# Patient Record
Sex: Male | Born: 1990 | Race: White | Hispanic: No | Marital: Single | Smoking: Never smoker
Health system: Southern US, Community
[De-identification: ages and names within clinical notes are randomized; demographics above are authoritative.]

## PROBLEM LIST (undated history)

## (undated) DIAGNOSIS — F1410 Cocaine abuse, uncomplicated: Secondary | ICD-10-CM

## (undated) DIAGNOSIS — G47419 Narcolepsy without cataplexy: Secondary | ICD-10-CM

## (undated) DIAGNOSIS — R569 Unspecified convulsions: Secondary | ICD-10-CM

## (undated) HISTORY — DX: Cocaine abuse, uncomplicated: F14.10

## (undated) HISTORY — DX: Unspecified convulsions: R56.9

## (undated) HISTORY — PX: SHOULDER SURGERY: SHX246

---

## 2011-05-14 ENCOUNTER — Emergency Department (HOSPITAL_COMMUNITY)
Admission: EM | Admit: 2011-05-14 | Discharge: 2011-05-14 | Disposition: A | Discharge status: Home / Self Care | Payer: BC Managed Care – HMO | Attending: Emergency Medicine | Admitting: Emergency Medicine

## 2011-05-14 ENCOUNTER — Emergency Department (HOSPITAL_COMMUNITY): Payer: BC Managed Care – HMO

## 2011-05-14 DIAGNOSIS — W108XXA Fall (on) (from) other stairs and steps, initial encounter: Secondary | ICD-10-CM | POA: Insufficient documentation

## 2011-05-14 DIAGNOSIS — R220 Localized swelling, mass and lump, head: Secondary | ICD-10-CM | POA: Insufficient documentation

## 2011-05-14 DIAGNOSIS — R51 Headache: Secondary | ICD-10-CM | POA: Insufficient documentation

## 2011-05-14 DIAGNOSIS — R110 Nausea: Secondary | ICD-10-CM | POA: Insufficient documentation

## 2011-05-14 DIAGNOSIS — R42 Dizziness and giddiness: Secondary | ICD-10-CM | POA: Insufficient documentation

## 2011-05-14 DIAGNOSIS — S0180XA Unspecified open wound of other part of head, initial encounter: Secondary | ICD-10-CM | POA: Insufficient documentation

## 2011-08-12 ENCOUNTER — Ambulatory Visit (INDEPENDENT_AMBULATORY_CARE_PROVIDER_SITE_OTHER): Payer: PRIVATE HEALTH INSURANCE

## 2011-08-12 DIAGNOSIS — B308 Other viral conjunctivitis: Secondary | ICD-10-CM

## 2014-02-04 DIAGNOSIS — M25519 Pain in unspecified shoulder: Secondary | ICD-10-CM | POA: Insufficient documentation

## 2015-05-15 ENCOUNTER — Encounter (HOSPITAL_COMMUNITY): Payer: Self-pay | Admitting: Emergency Medicine

## 2015-05-15 ENCOUNTER — Emergency Department (HOSPITAL_COMMUNITY)
Admission: EM | Admit: 2015-05-15 | Discharge: 2015-05-15 | Disposition: A | Discharge status: Home / Self Care | Payer: BC Managed Care – PPO | Attending: Emergency Medicine | Admitting: Emergency Medicine

## 2015-05-15 DIAGNOSIS — Y9363 Activity, rugby: Secondary | ICD-10-CM | POA: Diagnosis not present

## 2015-05-15 DIAGNOSIS — S0990XA Unspecified injury of head, initial encounter: Secondary | ICD-10-CM | POA: Diagnosis present

## 2015-05-15 DIAGNOSIS — Y998 Other external cause status: Secondary | ICD-10-CM | POA: Diagnosis not present

## 2015-05-15 DIAGNOSIS — Z8669 Personal history of other diseases of the nervous system and sense organs: Secondary | ICD-10-CM | POA: Diagnosis not present

## 2015-05-15 DIAGNOSIS — W500XXA Accidental hit or strike by another person, initial encounter: Secondary | ICD-10-CM | POA: Diagnosis not present

## 2015-05-15 DIAGNOSIS — S060X0A Concussion without loss of consciousness, initial encounter: Secondary | ICD-10-CM | POA: Insufficient documentation

## 2015-05-15 DIAGNOSIS — Y9289 Other specified places as the place of occurrence of the external cause: Secondary | ICD-10-CM | POA: Diagnosis not present

## 2015-05-15 DIAGNOSIS — S80212A Abrasion, left knee, initial encounter: Secondary | ICD-10-CM | POA: Diagnosis not present

## 2015-05-15 HISTORY — DX: Narcolepsy without cataplexy: G47.419

## 2015-05-15 MED ORDER — BACITRACIN ZINC 500 UNIT/GM EX OINT
TOPICAL_OINTMENT | CUTANEOUS | Status: AC
  Administered 2015-05-15: 16:00:00
  Filled 2015-05-15: qty 0.9

## 2015-05-15 NOTE — Discharge Instructions (Signed)
Recheck with physician prior to resuming any sports activities.   Concussion A concussion, or closed-head injury, is a brain injury caused by a direct blow to the head or by a quick and sudden movement (jolt) of the head or neck. Concussions are usually not life-threatening. Even so, the effects of a concussion can be serious. If you have had a concussion before, you are more likely to experience concussion-like symptoms after a direct blow to the head.  CAUSES  Direct blow to the head, such as from running into another player during a soccer game, being hit in a fight, or hitting your head on a hard surface.  A jolt of the head or neck that causes the brain to move back and forth inside the skull, such as in a car crash. SIGNS AND SYMPTOMS The signs of a concussion can be hard to notice. Early on, they may be missed by you, family members, and health care providers. You may look fine but act or feel differently. Symptoms are usually temporary, but they may last for days, weeks, or even longer. Some symptoms may appear right away while others may not show up for hours or days. Every head injury is different. Symptoms include:  Mild to moderate headaches that will not go away.  A feeling of pressure inside your head.  Having more trouble than usual:  Learning or remembering things you have heard.  Answering questions.  Paying attention or concentrating.  Organizing daily tasks.  Making decisions and solving problems.  Slowness in thinking, acting or reacting, speaking, or reading.  Getting lost or being easily confused.  Feeling tired all the time or lacking energy (fatigued).  Feeling drowsy.  Sleep disturbances.  Sleeping more than usual.  Sleeping less than usual.  Trouble falling asleep.  Trouble sleeping (insomnia).  Loss of balance or feeling lightheaded or dizzy.  Nausea or vomiting.  Numbness or tingling.  Increased sensitivity  to:  Sounds.  Lights.  Distractions.  Vision problems or eyes that tire easily.  Diminished sense of taste or smell.  Ringing in the ears.  Mood changes such as feeling sad or anxious.  Becoming easily irritated or angry for little or no reason.  Lack of motivation.  Seeing or hearing things other people do not see or hear (hallucinations). DIAGNOSIS Your health care provider can usually diagnose a concussion based on a description of your injury and symptoms. He or she will ask whether you passed out (lost consciousness) and whether you are having trouble remembering events that happened right before and during your injury. Your evaluation might include:  A brain scan to look for signs of injury to the brain. Even if the test shows no injury, you may still have a concussion.  Blood tests to be sure other problems are not present. TREATMENT  Concussions are usually treated in an emergency department, in urgent care, or at a clinic. You may need to stay in the hospital overnight for further treatment.  Tell your health care provider if you are taking any medicines, including prescription medicines, over-the-counter medicines, and natural remedies. Some medicines, such as blood thinners (anticoagulants) and aspirin, may increase the chance of complications. Also tell your health care provider whether you have had alcohol or are taking illegal drugs. This information may affect treatment.  Your health care provider will send you home with important instructions to follow.  How fast you will recover from a concussion depends on many factors. These factors include how severe  your concussion is, what part of your brain was injured, your age, and how healthy you were before the concussion.  Most people with mild injuries recover fully. Recovery can take time. In general, recovery is slower in older persons. Also, persons who have had a concussion in the past or have other medical  problems may find that it takes longer to recover from their current injury. HOME CARE INSTRUCTIONS General Instructions  Carefully follow the directions your health care provider gave you.  Only take over-the-counter or prescription medicines for pain, discomfort, or fever as directed by your health care provider.  Take only those medicines that your health care provider has approved.  Do not drink alcohol until your health care provider says you are well enough to do so. Alcohol and certain other drugs may slow your recovery and can put you at risk of further injury.  If it is harder than usual to remember things, write them down.  If you are easily distracted, try to do one thing at a time. For example, do not try to watch TV while fixing dinner.  Talk with family members or close friends when making important decisions.  Keep all follow-up appointments. Repeated evaluation of your symptoms is recommended for your recovery.  Watch your symptoms and tell others to do the same. Complications sometimes occur after a concussion. Older adults with a brain injury may have a higher risk of serious complications, such as a blood clot on the brain.  Tell your teachers, school nurse, school counselor, coach, athletic trainer, or work Production designer, theatre/television/film about your injury, symptoms, and restrictions. Tell them about what you can or cannot do. They should watch for:  Increased problems with attention or concentration.  Increased difficulty remembering or learning new information.  Increased time needed to complete tasks or assignments.  Increased irritability or decreased ability to cope with stress.  Increased symptoms.  Rest. Rest helps the brain to heal. Make sure you:  Get plenty of sleep at night. Avoid staying up late at night.  Keep the same bedtime hours on weekends and weekdays.  Rest during the day. Take daytime naps or rest breaks when you feel tired.  Limit activities that require a  lot of thought or concentration. These include:  Doing homework or job-related work.  Watching TV.  Working on the computer.  Avoid any situation where there is potential for another head injury (football, hockey, soccer, basketball, martial arts, downhill snow sports and horseback riding). Your condition will get worse every time you experience a concussion. You should avoid these activities until you are evaluated by the appropriate follow-up health care providers. Returning To Your Regular Activities You will need to return to your normal activities slowly, not all at once. You must give your body and brain enough time for recovery.  Do not return to sports or other athletic activities until your health care provider tells you it is safe to do so.  Ask your health care provider when you can drive, ride a bicycle, or operate heavy machinery. Your ability to react may be slower after a brain injury. Never do these activities if you are dizzy.  Ask your health care provider about when you can return to work or school. Preventing Another Concussion It is very important to avoid another brain injury, especially before you have recovered. In rare cases, another injury can lead to permanent brain damage, brain swelling, or death. The risk of this is greatest during the first 7-10 days  after a head injury. Avoid injuries by:  Wearing a seat belt when riding in a car.  Drinking alcohol only in moderation.  Wearing a helmet when biking, skiing, skateboarding, skating, or doing similar activities.  Avoiding activities that could lead to a second concussion, such as contact or recreational sports, until your health care provider says it is okay.  Taking safety measures in your home.  Remove clutter and tripping hazards from floors and stairways.  Use grab bars in bathrooms and handrails by stairs.  Place non-slip mats on floors and in bathtubs.  Improve lighting in dim areas. SEEK MEDICAL  CARE IF:  You have increased problems paying attention or concentrating.  You have increased difficulty remembering or learning new information.  You need more time to complete tasks or assignments than before.  You have increased irritability or decreased ability to cope with stress.  You have more symptoms than before. Seek medical care if you have any of the following symptoms for more than 2 weeks after your injury:  Lasting (chronic) headaches.  Dizziness or balance problems.  Nausea.  Vision problems.  Increased sensitivity to noise or light.  Depression or mood swings.  Anxiety or irritability.  Memory problems.  Difficulty concentrating or paying attention.  Sleep problems.  Feeling tired all the time. SEEK IMMEDIATE MEDICAL CARE IF:  You have severe or worsening headaches. These may be a sign of a blood clot in the brain.  You have weakness (even if only in one hand, leg, or part of the face).  You have numbness.  You have decreased coordination.  You vomit repeatedly.  You have increased sleepiness.  One pupil is larger than the other.  You have convulsions.  You have slurred speech.  You have increased confusion. This may be a sign of a blood clot in the brain.  You have increased restlessness, agitation, or irritability.  You are unable to recognize people or places.  You have neck pain.  It is difficult to wake you up.  You have unusual behavior changes.  You lose consciousness. MAKE SURE YOU:  Understand these instructions.  Will watch your condition.  Will get help right away if you are not doing well or get worse. Document Released: 11/11/2003 Document Revised: 08/26/2013 Document Reviewed: 03/13/2013 [facility] Patient Information 2015 [place], [place]. This information is not intended to replace advice given to you by your health care provider. Make sure you discuss any questions you have with your health care  provider.  Concussion Direct trauma to the head often causes a condition known as a concussion. This injury can temporarily interfere with brain function and may cause you to pass out (lose consciousness). The consequences of a concussion are usually short-term, but repetitive concussions can be very dangerous. If you have multiple concussions, you will have a greater risk of long-term effects, such as slurred speech, slow movements, impaired thinking, or tremors. The severity of a concussion is based on the length and severity of the interference with brain activity. SYMPTOMS  Symptoms of a concussion vary depending on the severity of the injury. Very mild concussions may even occur without any noticeable symptoms. Swelling in the area of the injury is not related to the seriousness of the injury.   Mild concussion:  Temporary loss of consciousness may or may not occur.  Memory loss (amnesia) for a short time.  Emotional instability.  Confusion.  Severe concussion:  Usually prolonged loss of consciousness.  Confusion  One pupil (the  black part in the middle of the eye) is larger than the other.  Changes in vision (including blurring).  Changes in breathing.  Disturbed balance (equilibrium).  Headaches.  Confusion.  Nausea or vomiting.  Slower reaction time than normal.  Difficulty learning and remembering things you have heard. CAUSES  A concussion is the result of trauma to the head. When the head is subjected to such an injury, the brain strikes against the inner wall of the skull. This impact is what causes the damage to the brain. The force of injury is related to severity of injury. The most severe concussions are associated with incidents that involve large impact forces such as motor vehicle accidents. Wearing a helmet will reduce the severity of trauma to the head, but concussions may still occur if you are wearing a helmet. RISK INCREASES WITH:  Contact sports  (football, hockey, soccer, rugby, basketball or lacrosse).  Fighting sports (martial arts or boxing).  Riding bicycles, motorcycles, or horses (when you ride without a helmet). PREVENTION  Wear proper protective headgear and ensure correct fit.  Wear seat belts when driving and riding in a car.  Do not drink or use mind-altering drugs and drive. PROGNOSIS  Concussions are typically curable if they are recognized and treated early. If a severe concussion or multiple concussions go untreated, then the complications may be life-threatening or cause permanent disability and brain damage. RELATED COMPLICATIONS   Permanent brain damage (slurred speech, slow movement, impaired thinking, or tremors).  Bleeding under the skull (subdural hemorrhage or hematoma, epidural hematoma).  Bleeding into the brain.  Prolonged healing time if usual activities are resumed too soon.  Infection if skin over the concussion site is broken.  Increased risk of future concussions (less trauma is required for a second concussion than the first). TREATMENT  Treatment initially requires immediate evaluation to determine the severity of the concussion. Occasionally, a hospital stay may be required for observation and treatment.  Avoid exertion. Bed rest for the first 24-48 hours is recommended.  Return to play is a controversial subject due to the increased risk for future injury as well as permanent disability and should be discussed at length with your treating caregiver. Many factors such as the severity of the concussion and whether this is the first, second, or third concussion play a role in timing a patient's return to sports.  MEDICATION  Do not give any medicine, including non-prescription acetaminophen or aspirin, until the diagnosis is certain. These medicines may mask developing symptoms.  SEEK IMMEDIATE MEDICAL CARE IF:   Symptoms get worse or do not improve in 24 hours.  Any of the following  symptoms occur:  Vomiting.  The inability to move arms and legs equally well on both sides.  Fever.  Neck stiffness.  Pupils of unequal size, shape, or reactivity.  Convulsions.  Noticeable restlessness.  Severe headache that persists for longer than 4 hours after injury.  Confusion, disorientation, or mental status changes. Document Released: 08/21/2005 Document Revised: 06/11/2013 Document Reviewed: 12/03/2008 [facility] Patient Information 2015 [place], [place]. This information is not intended to replace advice given to you by your health care provider. Make sure you discuss any questions you have with your health care provider.

## 2015-05-15 NOTE — Progress Notes (Addendum)
MD in with pt. Pt has clear speech . Pt does not have a HA - left knee cleansed with sterile water and bactriacn. Pt refused a bandaide-. Discussed pt concerns about narcolepsy with+he PA

## 2015-05-15 NOTE — ED Provider Notes (Signed)
CSN: [id number]     Arrival date & time 05/15/15  1440 History   First MD Initiated Contact with Patient 05/15/15 1504     Chief Complaint  Patient presents with  . Head Injury     (Consider location/radiation/quality/duration/timing/severity/associated sxs/prior Treatment) HPI 24 y.o. Male playing rugby just prior to admission reports blow to head by leg/knee of opponent.  Denies loc , states "bell rung".  He reports that he had difficulty remembering things told to him, some headache, and nausea.  He tried to play again but felt he couldn't.  Patient seen and reports screened by medic and brought here.  He reports one prior significant concussion in high school that was "much worse."  No significant headache now, no loc, no confusion now, remembers event.  Past Medical History  Diagnosis Date  . Narcolepsy    Past Surgical History  Procedure Laterality Date  . Shoulder surgery Right    No family history on file. Social History  Substance Use Topics  . Smoking status: Never Smoker   . Smokeless tobacco: None  . Alcohol Use: Yes    Review of Systems  Musculoskeletal:       Abrasion knee  All other systems reviewed and are negative.     Allergies  Review of patient's allergies indicates no known allergies.  Home Medications   Prior to Admission medications   Not on File   BP 129/67 mmHg  Pulse 93  Temp(Src) 97.9 F (36.6 C) (Oral)  Resp 20  SpO2 97% Physical Exam  Constitutional: He is oriented to person, place, and time. He appears well-developed and well-nourished.  HENT:  Head: Normocephalic and atraumatic.  Right Ear: Tympanic membrane and external ear normal.  Left Ear: Tympanic membrane and external ear normal.  Nose: Nose normal. Right sinus exhibits no maxillary sinus tenderness and no frontal sinus tenderness. Left sinus exhibits no maxillary sinus tenderness and no frontal sinus tenderness.  Eyes: Conjunctivae and EOM are normal. Pupils are equal,  round, and reactive to light. Right eye exhibits no nystagmus. Left eye exhibits no nystagmus.  Neck: Normal range of motion. Neck supple.  Cardiovascular: Normal rate, regular rhythm, normal heart sounds and intact distal pulses.   Pulmonary/Chest: Effort normal and breath sounds normal. No respiratory distress. He exhibits no tenderness.  Abdominal: Soft. Bowel sounds are normal. He exhibits no distension and no mass. There is no tenderness.  Musculoskeletal: Normal range of motion. He exhibits no edema or tenderness.  Abrasion left knee  Neurological: He is alert and oriented to person, place, and time. He has normal strength and normal reflexes. No sensory deficit. He displays a negative Romberg sign. GCS eye subscore is 4. GCS verbal subscore is 5. GCS motor subscore is 6.  Reflex Scores:      Tricep reflexes are 2+ on the right side and 2+ on the left side.      Bicep reflexes are 2+ on the right side and 2+ on the left side.      Brachioradialis reflexes are 2+ on the right side and 2+ on the left side.      Patellar reflexes are 2+ on the right side and 2+ on the left side.      Achilles reflexes are 2+ on the right side and 2+ on the left side. Patient with normal gait without ataxia, shuffling, spasm, or antalgia. Speech is normal without dysarthria, dysphasia, or aphasia. Muscle strength is 5/5 in bilateral shoulders, elbow flexor and extensors,  wrist flexor and extensors, and intrinsic hand muscles. 5/5 bilateral lower extremity hip flexors, extensors, knee flexors and extensors, and ankle dorsi and plantar flexors.    Skin: Skin is warm and dry. No rash noted.  Psychiatric: He has a normal mood and affect. His behavior is normal. Judgment and thought content normal.  Nursing note and vitals reviewed.   ED Course  Procedures (including critical care time) Patient to have wounds cleaned.  Friend to be advised of return precautions.   MDM   Final diagnoses:  Concussion,  without loss of consciousness, initial encounter    Discussed with patient indications for head ct including risk of significant ic injury which could benefit from surgery vs risk of radiation.  Patient states would rather not have head ct- I think this is reasonable as patient without significant risk factors - no anticoagulants, no intoxication, no loc, normal neuro exam and has someone to stay with him. Discussed need for follow up and neuro referral given if patient is having problems at first check.  Advised of return precautions and voices understanding.    Margarita Grizzle, MD 05/15/15 [phone]

## 2015-05-15 NOTE — ED Notes (Addendum)
Per EMS: Pt had knee to head during rugby. No LOC but c/o nausea, confusion and competitive questioning. Pt answering questions appropriately in triage.

## 2016-07-30 ENCOUNTER — Emergency Department (HOSPITAL_COMMUNITY): Payer: BC Managed Care – PPO

## 2016-07-30 ENCOUNTER — Emergency Department (HOSPITAL_COMMUNITY)
Admission: EM | Admit: 2016-07-30 | Discharge: 2016-07-30 | Disposition: A | Discharge status: Home / Self Care | Payer: BC Managed Care – PPO | Attending: Emergency Medicine | Admitting: Emergency Medicine

## 2016-07-30 ENCOUNTER — Encounter (HOSPITAL_COMMUNITY): Payer: Self-pay | Admitting: Emergency Medicine

## 2016-07-30 DIAGNOSIS — W109XXA Fall (on) (from) unspecified stairs and steps, initial encounter: Secondary | ICD-10-CM | POA: Insufficient documentation

## 2016-07-30 DIAGNOSIS — Y999 Unspecified external cause status: Secondary | ICD-10-CM | POA: Diagnosis not present

## 2016-07-30 DIAGNOSIS — S0990XA Unspecified injury of head, initial encounter: Secondary | ICD-10-CM | POA: Diagnosis present

## 2016-07-30 DIAGNOSIS — S0181XA Laceration without foreign body of other part of head, initial encounter: Secondary | ICD-10-CM

## 2016-07-30 DIAGNOSIS — Z23 Encounter for immunization: Secondary | ICD-10-CM | POA: Insufficient documentation

## 2016-07-30 DIAGNOSIS — Z79899 Other long term (current) drug therapy: Secondary | ICD-10-CM | POA: Insufficient documentation

## 2016-07-30 DIAGNOSIS — Y929 Unspecified place or not applicable: Secondary | ICD-10-CM | POA: Diagnosis not present

## 2016-07-30 DIAGNOSIS — Y939 Activity, unspecified: Secondary | ICD-10-CM | POA: Insufficient documentation

## 2016-07-30 LAB — CBG MONITORING, ED: Glucose-Capillary: 132 mg/dL — ABNORMAL HIGH (ref 65–99)

## 2016-07-30 MED ORDER — LIDOCAINE-EPINEPHRINE (PF) 2 %-1:200000 IJ SOLN
10.0000 mL | Freq: Once | INTRAMUSCULAR | Status: AC
  Administered 2016-07-30: 10 mL via INTRADERMAL
  Filled 2016-07-30: qty 20

## 2016-07-30 MED ORDER — TETANUS-DIPHTH-ACELL PERTUSSIS 5-2.5-18.5 LF-MCG/0.5 IM SUSP
0.5000 mL | Freq: Once | INTRAMUSCULAR | Status: AC
  Administered 2016-07-30: 0.5 mL via INTRAMUSCULAR
  Filled 2016-07-30: qty 0.5

## 2016-07-30 MED ORDER — ACETAMINOPHEN 325 MG PO TABS
650.0000 mg | ORAL_TABLET | Freq: Once | ORAL | Status: AC
  Administered 2016-07-30: 650 mg via ORAL
  Filled 2016-07-30: qty 2

## 2016-07-30 MED ORDER — LIDOCAINE HCL (PF) 1 % IJ SOLN
INTRAMUSCULAR | Status: AC
  Filled 2016-07-30: qty 5

## 2016-07-30 MED ORDER — EPINEPHRINE PF 1 MG/ML IJ SOLN
INTRAMUSCULAR | Status: AC
  Filled 2016-07-30: qty 1

## 2016-07-30 NOTE — Discharge Instructions (Signed)
Your head injury is likely due to narcoleptic episodes.  Please avoid alcohol use and make sure you get adequate sleep. Your sutures will need to be removed in 3-5 days.  Take tylenol as needed for headache.  Return if you have any concerns.
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
## 2016-07-30 NOTE — ED Provider Notes (Signed)
[facility]PT Provider Note   CSN: [id number] Arrival date & time: 07/30/16  1850     History   Chief Complaint Chief Complaint  Patient presents with  . Head Injury  . Head Laceration  . Altered Mental Status    HPI Brad Walker is a 25 y.o. male.  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 Patient admits to drinking heavily the night before. States that he hadn't been sleeping well. Today while sitting in bed, he had several episodes of near syncopal sensation in which he classified as "a half drop" where he nearly falls asleep. He called his girlfriend and was concerned, and decided to go to sleep. When he woke up, he noticed blood on his pillowcase as well as blood against the headboard and the door in his room. He believes he may have had a narcoleptic episode causing head injury. He did notice a laceration to his forehead. Complaining of mild frontal headache. Denies increased confusion, neck pain, chest pain, short of breath, abdominal pain, or back pain. He did notice a small bruise to the back of his left shoulder but denies any significant shoulder pain. Admits to remote history of cocaine use, last use was 2 months ago. States that his last narcolepsy episode was approximately a year ago. Unable to recall last tetanus status.  Past Medical History:  Diagnosis Date  . Narcolepsy     There are no active problems to display for this patient.   Past Surgical History:  Procedure Laterality Date  . SHOULDER SURGERY Right        Home Medications    Prior to Admission medications   Not on File    Family History No family history on file.  Social History Social History  Substance Use Topics  . Smoking status: Never Smoker  . Smokeless tobacco: Never Used  . Alcohol use Yes     Allergies   Patient has no known allergies.   Review of Systems Review of Systems  All other systems reviewed and are  negative.    Physical Exam Updated Vital Signs BP (!) 143/103 (BP Location: Right Arm)   Pulse 92   Temp 98 F (36.7 C) (Oral)   Resp 16   SpO2 99%   Physical Exam  Constitutional: He is oriented to person, place, and time. He appears well-developed and well-nourished. No distress.  HENT:  Head: Atraumatic.  Ecchymosis and abrasion noted to mid forehead with a less than 1 cm superficial laceration to left forehead mildly tender to palpation but no crepitus.  No hemotympanum, no septal hematoma, no occlusion, no midface tenderness. No scalp tenderness.  Eyes: Conjunctivae and EOM are normal. Pupils are equal, round, and reactive to light.  Neck: Normal range of motion. Neck supple.  No cervical midline spine tenderness  Cardiovascular: Normal rate and regular rhythm.   Pulmonary/Chest: Effort normal and breath sounds normal.  Abdominal: Soft. There is no tenderness.  Musculoskeletal: He exhibits tenderness (Left shoulder: Mild abrasion noted to posterior shoulder with mild tenderness. Shoulder with full range of motion and no gross deformity.).  Neurological: He is alert and oriented to person, place, and time. He has normal strength. No cranial nerve deficit or sensory deficit. GCS eye subscore is 4. GCS verbal subscore is 5. GCS motor subscore is 6.  Skin: No rash noted.  Psychiatric: He has a normal mood and affect.  Nursing note and vitals reviewed.  ED Treatments / Results  Labs (all labs ordered are listed, but only abnormal results are displayed) Labs Reviewed  CBG MONITORING, ED - Abnormal; Notable for the following:       Result Value   Glucose-Capillary 132 (*)    All other components within normal limits    EKG  EKG Interpretation  Date/Time:  Sunday July 30 2016 19:00:33 EST Ventricular Rate:  96 PR Interval:  150 QRS Duration: 96 QT Interval:  340 QTC Calculation: 429 R Axis:   98 Text Interpretation:  Normal sinus rhythm Rightward axis  Borderline ECG No previous tracing Confirmed by KNOTT MD, DANIEL ([id number]) on 07/30/2016 7:58:10 PM       Radiology No results found.  Procedures Procedures (including critical care time)  LACERATION REPAIR Performed by: Fayrene HelperRAN,Etrulia Zarr Authorized by: Fayrene HelperRAN,Sebrina Kessner Consent: Verbal consent obtained. Risks and benefits: risks, benefits and alternatives were discussed Consent given by: patient Patient identity confirmed: provided demographic data Prepped and Draped in normal sterile fashion Wound explored  Laceration Location: forehead  Laceration Length: 1cm  No Foreign Bodies seen or palpated  Anesthesia: local infiltration  Local anesthetic: lidocaine 2% w epinephrine  Anesthetic total: 1 ml  Irrigation method: syringe Amount of cleaning: standard  Skin closure: prolene 5.0  Number of sutures: 3  Technique: simple interrupted.    Patient tolerance: Patient tolerated the procedure well with no immediate complications.   Medications Ordered in ED Medications - No data to display   Initial Impression / Assessment and Plan / ED Course  I have reviewed the triage vital signs and the nursing notes.  Pertinent labs & imaging results that were available during my care of the patient were reviewed by me and considered in my medical decision making (see chart for details).  Clinical Course     BP 126/83   Pulse 95   Temp 98 F (36.7 C) (Oral)   Resp 16   SpO2 97%    Final Clinical Impressions(s) / ED Diagnoses   Final diagnoses:  Laceration of forehead, initial encounter    New Prescriptions New Prescriptions   No medications on file   7:27 PM Patient with history of narcolepsy resenting with forehead injury likely due to a narcoleptic episode. Suspect causative factor due to recent alcohol use. States he drank 5-6 beer last night. He has no focal neuro deficit on exam. He does have a small laceration to his forehead in which we will repair. Tetanus will be  updated.  8:03 PM Pt sign out to oncoming provider who will d/c pt once head CT resulted and normal.  Pt understand to avoid alcohol use and to have a normal sleep schedule to decrease risk of narcolepsy risking injury.  Care discussed with Dr. Clydene PughKnott.    Fayrene HelperBowie Sible Straley, PA-C 07/30/16 2010    Lyndal Pulleyaniel Knott, MD 08/01/16 [phone]

## 2016-07-30 NOTE — ED Notes (Signed)
CBG resulted: 132. RN notified.

## 2016-07-30 NOTE — ED Triage Notes (Signed)
Pt reports has history of narcolepsy, woke up this afternoon feeling dazed and noticed blood on his pillow.  Pt reports noticed blood on his floor also. Pt and visitor reports incident must have occurred between 4-5pm today.

## 2016-10-19 ENCOUNTER — Emergency Department (HOSPITAL_COMMUNITY)
Admission: EM | Admit: 2016-10-19 | Discharge: 2016-10-19 | Disposition: A | Discharge status: Home / Self Care | Payer: BC Managed Care – PPO | Attending: Emergency Medicine | Admitting: Emergency Medicine

## 2016-10-19 ENCOUNTER — Encounter (HOSPITAL_COMMUNITY): Payer: Self-pay | Admitting: Family Medicine

## 2016-10-19 ENCOUNTER — Emergency Department (HOSPITAL_COMMUNITY): Payer: BC Managed Care – PPO

## 2016-10-19 DIAGNOSIS — R569 Unspecified convulsions: Secondary | ICD-10-CM | POA: Diagnosis not present

## 2016-10-19 DIAGNOSIS — R930 Abnormal findings on diagnostic imaging of skull and head, not elsewhere classified: Secondary | ICD-10-CM | POA: Diagnosis not present

## 2016-10-19 DIAGNOSIS — Z79899 Other long term (current) drug therapy: Secondary | ICD-10-CM | POA: Insufficient documentation

## 2016-10-19 LAB — CBC WITH DIFFERENTIAL/PLATELET
Basophils Absolute: 0 10*3/uL (ref 0.0–0.1)
Basophils Relative: 0 %
EOS ABS: 0.2 10*3/uL (ref 0.0–0.7)
EOS PCT: 2 %
HCT: 47.5 % (ref 39.0–52.0)
HEMOGLOBIN: 16.8 g/dL (ref 13.0–17.0)
LYMPHS ABS: 1.3 10*3/uL (ref 0.7–4.0)
LYMPHS PCT: 15 %
MCH: 31.1 pg (ref 26.0–34.0)
MCHC: 35.4 g/dL (ref 30.0–36.0)
MCV: 88 fL (ref 78.0–100.0)
MONOS PCT: 5 %
Monocytes Absolute: 0.4 10*3/uL (ref 0.1–1.0)
Neutro Abs: 6.8 10*3/uL (ref 1.7–7.7)
Neutrophils Relative %: 78 %
PLATELETS: 175 10*3/uL (ref 150–400)
RBC: 5.4 MIL/uL (ref 4.22–5.81)
RDW: 12.7 % (ref 11.5–15.5)
WBC: 8.8 10*3/uL (ref 4.0–10.5)

## 2016-10-19 LAB — COMPREHENSIVE METABOLIC PANEL
ALT: 61 U/L (ref 17–63)
AST: 44 U/L — AB (ref 15–41)
Albumin: 4.4 g/dL (ref 3.5–5.0)
Alkaline Phosphatase: 51 U/L (ref 38–126)
Anion gap: 12 (ref 5–15)
BUN: 8 mg/dL (ref 6–20)
CHLORIDE: 105 mmol/L (ref 101–111)
CO2: 23 mmol/L (ref 22–32)
CREATININE: 1.05 mg/dL (ref 0.61–1.24)
Calcium: 9.7 mg/dL (ref 8.9–10.3)
Glucose, Bld: 92 mg/dL (ref 65–99)
Potassium: 4.4 mmol/L (ref 3.5–5.1)
Sodium: 140 mmol/L (ref 135–145)
Total Bilirubin: 0.9 mg/dL (ref 0.3–1.2)
Total Protein: 6.9 g/dL (ref 6.5–8.1)

## 2016-10-19 LAB — MAGNESIUM: MAGNESIUM: 2 mg/dL (ref 1.7–2.4)

## 2016-10-19 MED ORDER — SODIUM CHLORIDE 0.9 % IV BOLUS (SEPSIS)
1000.0000 mL | Freq: Once | INTRAVENOUS | Status: AC
  Administered 2016-10-19: 1000 mL via INTRAVENOUS

## 2016-10-19 NOTE — ED Provider Notes (Signed)
[facility] Provider Note   CSN: [id number] Arrival date & time: 10/19/16  1746     History   Chief Complaint Chief Complaint  Patient presents with  . Seizures    HPI Brad Walker is a 26 y.o. male.  26 year old male with a history of narcolepsy presents to the ED today with complaints of seizure. Patient with history of narcolepsy and is currently on nuvigil and effexor but has not taken his medication regularly. States that he has been increased episodes of daytime sleepiness over the past few weeks. He is trying to get in to see a neurologist in the area but has been unable to do so. The patient states that he woke up this morning with increased symptoms of his narcolepsy includes excessive sleepiness and "drops".  He stayed home from work and laid in bed the entire day to rest. Patient then had an unwitnessed seizure while in bed however his friend did come and while he was seizing and states that patient's full body was shaking. His eyes rolled back in his head and he was unconscious. Friend states that while he was there the seizing last for approximately 1 minute. After that he was very groggy with heavy breathing. Took approximately 2 minutes to regain consciousness and was confused after the incident. Patient did bite his tongue. Denies any incontinence. Patient did not hit his head as he was laying on his bed. Patient states he has never had a seizure before. States last use was laying in bed calling his girlfriend. Then he woke up with EMS around him. The patient not had any seizures while in route by EMS and in the ED. He denies any infectious symptoms including cough, abdominal pain, urinary symptoms, change in bowel habits. Patient denies any fever, chills, headache, vision changes, lightheadedness, dizziness, chest pain, shortness of breath, abdominal pain, paresthesias. Denies any other injuries.      Past Medical History:  Diagnosis Date  . Narcolepsy     There  are no active problems to display for this patient.   Past Surgical History:  Procedure Laterality Date  . SHOULDER SURGERY Right        Home Medications    Prior to Admission medications   Medication Sig Start Date End Date Taking? Authorizing Provider  Armodafinil (NUVIGIL) 150 MG tablet Take 150 mg by mouth daily.    Historical Provider, MD  venlafaxine (EFFEXOR) 37.5 MG tablet Take 37.5 mg by mouth daily.    Historical Provider, MD    Family History No family history on file.  Social History Social History  Substance Use Topics  . Smoking status: Never Smoker  . Smokeless tobacco: Never Used  . Alcohol use Yes     Allergies   Patient has no known allergies.   Review of Systems Review of Systems  Constitutional: Negative for chills and fever.  HENT: Negative for congestion.   Eyes: Negative for visual disturbance.  Respiratory: Negative for cough and shortness of breath.   Cardiovascular: Negative for chest pain and palpitations.  Gastrointestinal: Negative for abdominal pain, diarrhea, nausea and vomiting.  Genitourinary: Negative for dysuria, frequency, hematuria and urgency.  Musculoskeletal: Negative for neck pain and neck stiffness.  Skin: Positive for wound (tip of tongue).  Neurological: Positive for seizures. Negative for dizziness, syncope, weakness, light-headedness and headaches.  All other systems reviewed and are negative.    Physical Exam Updated Vital Signs BP 110/92   Pulse 109   Temp 99.1 F (37.3  C) (Oral)   Resp 19   Ht 5\' 10"  (1.778 m)   Wt 93 kg   SpO2 97%   BMI 29.41 kg/m   Physical Exam  Constitutional: He is oriented to person, place, and time. He appears well-developed and well-nourished. No distress.  HENT:  Head: Normocephalic and atraumatic.  Right Ear: Tympanic membrane, external ear and ear canal normal.  Left Ear: Tympanic membrane, external ear and ear canal normal.  Nose: Nose normal.  Mouth/Throat: Uvula is  midline, oropharynx is clear and moist and mucous membranes are normal.  Small abrasion to the tip of the tonuge. Bleeding is controlled.  Eyes: Conjunctivae and EOM are normal. Pupils are equal, round, and reactive to light. Right eye exhibits no discharge. Left eye exhibits no discharge. No scleral icterus.  Neck: Normal range of motion. Neck supple. No thyromegaly present.  No midline C-spine tenderness. No deformities or step-offs noted.  Cardiovascular: Normal rate, regular rhythm, normal heart sounds and intact distal pulses.  Exam reveals no gallop and no friction rub.   No murmur heard. Pulmonary/Chest: Effort normal and breath sounds normal. No respiratory distress.  Abdominal: Soft. Bowel sounds are normal. He exhibits no distension. There is no tenderness. There is no rebound and no guarding.  Musculoskeletal: Normal range of motion.  maex4  Lymphadenopathy:    He has no cervical adenopathy.  Neurological: He is alert and oriented to person, place, and time. GCS eye subscore is 4. GCS verbal subscore is 5. GCS motor subscore is 6.  The patient is alert, attentive, and oriented x 3. Speech is clear. Cranial nerve II-VII grossly intact. Negative pronator drift. Sensation intact. Strength 5/5 in all extremities. Reflexes 2+ and symmetric at biceps, triceps, knees, and ankles. Rapid alternating movement and fine finger movements intact. Romberg is absent. Posture and gait normal.   Skin: Skin is warm and dry.  Nursing note and vitals reviewed.    ED Treatments / Results  Labs (all labs ordered are listed, but only abnormal results are displayed) Labs Reviewed  COMPREHENSIVE METABOLIC PANEL - Abnormal; Notable for the following:       Result Value   AST 44 (*)    All other components within normal limits  CBC WITH DIFFERENTIAL/PLATELET  MAGNESIUM    EKG  EKG Interpretation  Date/Time:  Thursday October 19 2016 17:50:31 EST Ventricular Rate:  107 PR Interval:    QRS  Duration: 98 QT Interval:  312 QTC Calculation: 417 R Axis:   99 Text Interpretation:  Sinus tachycardia Borderline right axis deviation Since last tracing rate faster Confirmed by KNAPP  MD-J, JON ([id number]) on 10/19/2016 7:04:47 PM       Radiology Ct Head Wo Contrast  Result Date: 10/19/2016 CLINICAL DATA:  Seizure. EXAM: CT HEAD WITHOUT CONTRAST TECHNIQUE: Contiguous axial images were obtained from the base of the skull through the vertex without intravenous contrast. COMPARISON:  CT scan of July 30, 2016. FINDINGS: Brain: No evidence of acute infarction, hemorrhage, hydrocephalus, extra-axial collection or mass lesion/mass effect. Vascular: No hyperdense vessel or unexpected calcification. Skull: Normal. Negative for fracture or focal lesion. Sinuses/Orbits: No acute finding. Other: None. IMPRESSION: Normal head CT. Electronically Signed   By: Lupita Raider, M.D.   On: 10/19/2016 18:43    Procedures Procedures (including critical care time)  Medications Ordered in ED Medications  sodium chloride 0.9 % bolus 1,000 mL (not administered)     Initial Impression / Assessment and Plan / ED Course  I  have reviewed the triage vital signs and the nursing notes.  Pertinent labs & imaging results that were available during my care of the patient were reviewed by me and considered in my medical decision making (see chart for details).     Pt with history of narcolepsy had unwitnessed first time seizure today. Patient with no evidence of focal neuro deficits on physical exam and is at mental baseline.  Labs and imaging have been reviewed. Electrolytes normal. EKG with sinus tach not no acute changes. Pt seems slightly anxious in room. Fluid bolus improved hr. CT head is unremarkable. Spoke with Dr. Amada JupiterKirkpatrick with neurology concerning pt. States that pt should not start back takin his medication. He needs to follow up in the out patient setting. Does not feel that pt will benefit from  admission. He has no further recommendations for workup. Have given him an ambulatory referral. Pt has been in the ED for 4 hours without any further seizure like activity.  Spoke with patient and family in detail about driving restrictions until cleared by a neurologist.  Patient verbalizes understanding. VS are stable. Pt is hemodynamically stable, in NAD, & able to ambulate in the ED. Pain has been managed & has no complaints prior to dc. Pt is comfortable with above plan and is stable for discharge at this time. All questions were answered prior to disposition. Strict return precautions for f/u to the ED were discussed.     Final Clinical Impressions(s) / ED Diagnoses   Final diagnoses:  Seizure [facility](HCC)    New Prescriptions Discharge Medication List as of 10/19/2016  8:34 PM       Rise MuKenneth T Okema Rollinson, PA-C 10/19/16 [phone]    Linwood DibblesJon Knapp, MD 10/21/16 1529

## 2016-10-19 NOTE — Discharge Instructions (Signed)
Please stop taking her medications. I have sent you an ambulatory referral for neurology. Please avoid driving. If he have another seizure please return to the ED.

## 2016-10-19 NOTE — ED Triage Notes (Signed)
Pt presents from home via [facility] with c/o seizure - onset unwitnessed, but friend came into room while pt was seizing. Pt also has hx narcolepsy. Pt bit tongue, no other injury, no incontinence. PT A&Ox4 in NAD.

## 2016-10-19 NOTE — ED Notes (Signed)
Called lab to inquire as to delay in labs, they stated they are running it right now.

## 2016-10-23 ENCOUNTER — Encounter: Payer: Self-pay | Admitting: Neurology

## 2016-10-23 ENCOUNTER — Ambulatory Visit (INDEPENDENT_AMBULATORY_CARE_PROVIDER_SITE_OTHER): Payer: BC Managed Care – PPO | Admitting: Neurology

## 2016-10-23 VITALS — BP 139/85 | HR 68 | Ht 70.0 in | Wt 211.0 lb

## 2016-10-23 DIAGNOSIS — R569 Unspecified convulsions: Secondary | ICD-10-CM

## 2016-10-23 DIAGNOSIS — G47411 Narcolepsy with cataplexy: Secondary | ICD-10-CM | POA: Diagnosis not present

## 2016-10-23 HISTORY — DX: Unspecified convulsions: R56.9

## 2016-10-23 NOTE — Progress Notes (Signed)
Reason for visit: Seizure  Referring physician: [place]  Brad Walker is a 26 y.o. male  History of present illness:  Brad Walker is a 26 year old right-handed white male with a history of narcolepsy that was diagnosed around age 15. The patient was diagnosed by pulmonologist in the [place], [place] area following a sleep study. He has been placed on Effexor and Nuvigil, but he was still having some episodes of drop attacks occurring on the medication. The patient indicates that his narcolepsy manifested with sudden onset of what he thought was sleep, the patient would drop to the ground, sometimes with injury. The period of unconsciousness may last for several minutes or even for several hours. The patient does not have any dreams or hallucinations with these events, he reports no episodes of cataplexy. He has no family history of narcolepsy. The patient does report a history of several concussions associated with sporting events and with falls associated with the drop attacks. The patient does had one brother and the father who have had a single seizure in their lifetime. The patient had a witnessed seizure event that occurred around 10/19/2016. The patient was in bed at that time, he was talking to his girlfriend, and then suddenly his girlfriend noted that he was making choking noises. The girlfriend hung up and called the patient's roommate who came into the bedroom to check on him and saw him in a generalized seizure event associated with tongue biting, but with no incontinence of the bowels or the bladder. The patient went to the emergency room, a CT scan of the brain was done and was normal. The patient is referred to this office for an evaluation. The patient has never had any similar type events in the past. The patient does have a history of cocaine abuse, he stopped using cocaine around 08/04/2016. He denies any other illicit drug use. The patient has not been on the medications for  narcolepsy.   Past Medical History:  Diagnosis Date  . Narcolepsy   . Seizures (HCC) 10/23/2016    Past Surgical History:  Procedure Laterality Date  . SHOULDER SURGERY Right     Family History  Problem Relation Age of Onset  . Breast cancer Mother   . Diabetes Father   . Hypertension Father   . Depression Father   . Seizures Father   . Seizures Brother     Social history:  reports that he has never smoked. He has never used smokeless tobacco. He reports that he drinks alcohol. He reports that he does not use drugs.  Medications:  Prior to Admission medications   Not on File     No Known Allergies  ROS:  Out of a complete 14 system review of symptoms, the patient complains only of the following symptoms, and all other reviewed systems are negative.  Memory loss, seizure Depression, disinterest in activities Insomnia, sleepiness  Blood pressure 139/85, pulse 68, height 5\' 10"  (1.778 m), weight 211 lb (95.7 kg).  Physical Exam  General: The patient is alert and cooperative at the time of the examination.  Eyes: Pupils are equal, round, and reactive to light. Discs are flat bilaterally.  Neck: The neck is supple, no carotid bruits are noted.  Respiratory: The respiratory examination is clear.  Cardiovascular: The cardiovascular examination reveals a regular rate and rhythm, no obvious murmurs or rubs are noted.  Skin: Extremities are without significant edema.  Neurologic Exam  Mental status: The patient is alert and oriented  x 3 at the time of the examination. The patient has apparent normal recent and remote memory, with an apparently normal attention span and concentration ability.  Cranial nerves: Facial symmetry is present. There is good sensation of the face to pinprick and soft touch bilaterally. The strength of the facial muscles and the muscles to head turning and shoulder shrug are normal bilaterally. Speech is well enunciated, no aphasia or  dysarthria is noted. Extraocular movements are full. Visual fields are full. The tongue is midline, and the patient has symmetric elevation of the soft palate. No obvious hearing deficits are noted.  Motor: The motor testing reveals 5 over 5 strength of all 4 extremities. Good symmetric motor tone is noted throughout.  Sensory: Sensory testing is intact to pinprick, soft touch, vibration sensation, and position sense on all 4 extremities. No evidence of extinction is noted.  Coordination: Cerebellar testing reveals good finger-nose-finger and heel-to-shin bilaterally.  Gait and station: Gait is normal. Tandem gait is normal. Romberg is negative. No drift is seen.  Reflexes: Deep tendon reflexes are symmetric and normal bilaterally. Toes are downgoing bilaterally.   Assessment/Plan:  1. Generalized seizure event  2. History of narcolepsy, drop attacks  The patient claims that his drop attacks are associated with the narcolepsy, but the patient has true loss of consciousness with these events that may be brief or extended in time. The episodes are somewhat atypical for narcolepsy. A seizure disorder may be associated with drop attacks with true loss of consciousness. The patient has had a witnessed generalized seizure. He will be set for MRI evaluation of the brain, he will have an EEG study. The patient will be referred for a sleep evaluation. He will follow-up in 4 months. He is not to operate a motor vehicle for at least 6 months. He has no primary care physician.  Marlan Palau. Keith Willis MD 10/23/2016 10:02 AM  [facility] [address] [place], [place] [id number]  Phone [phone] Fax [phone]

## 2016-10-31 ENCOUNTER — Ambulatory Visit (INDEPENDENT_AMBULATORY_CARE_PROVIDER_SITE_OTHER): Payer: BC Managed Care – PPO | Admitting: Neurology

## 2016-10-31 ENCOUNTER — Telehealth: Payer: Self-pay | Admitting: Neurology

## 2016-10-31 DIAGNOSIS — R569 Unspecified convulsions: Secondary | ICD-10-CM

## 2016-10-31 NOTE — Telephone Encounter (Signed)
I called patient. EEG study was unremarkable. A sleep evaluation is pending.

## 2016-10-31 NOTE — Procedures (Signed)
    History:  Brad Walker is a 26 year old gentleman with a history of narcolepsy diagnosed at age 26. The patient is having episodes of drop attacks where he will suddenly fall, he believes that he may lose consciousness with the falls. The patient has had a witnessed seizure event on 10/19/2016. The patient was noted to have sudden onset of generalized jerking, associated with tongue biting. The patient is being evaluated for this event.  This is a routine EEG. No skull defects are noted. The patient is currently on no medications.   EEG classification: Normal awake and asleep  Description of the recording: The background rhythms of this recording consists of a fairly well modulated medium amplitude background activity of 10 Hz. As the record progresses, the patient initially is in the waking state, but appears to enter the early stage II sleep during the recording, with rudimentary sleep spindles and vertex sharp wave activity seen. During the wakeful state, photic stimulation is performed, and this results in a bilateral and symmetric photic driving response. Hyperventilation was also performed, and this results in a minimal buildup of the background rhythm activities without significant slowing seen. At no time during the recording does there appear to be evidence of spike or spike wave discharges or evidence of focal slowing. EKG monitor shows no evidence of cardiac rhythm abnormalities with a heart rate of 72.  Impression: This is a normal EEG recording in the waking and sleeping state. No evidence of ictal or interictal discharges were seen at any time during the recording.

## 2016-11-01 ENCOUNTER — Other Ambulatory Visit: Payer: BC Managed Care – PPO

## 2016-11-01 ENCOUNTER — Telehealth: Payer: Self-pay | Admitting: Neurology

## 2016-11-01 NOTE — Telephone Encounter (Signed)
I returned her call she is on dpr.. And r/s for 11/08/16

## 2016-11-01 NOTE — Telephone Encounter (Signed)
Patient's mother is calling to reschedule MRI.

## 2016-11-06 ENCOUNTER — Telehealth: Payer: Self-pay

## 2016-11-07 ENCOUNTER — Ambulatory Visit (INDEPENDENT_AMBULATORY_CARE_PROVIDER_SITE_OTHER): Payer: BC Managed Care – PPO | Admitting: Neurology

## 2016-11-07 ENCOUNTER — Encounter: Payer: Self-pay | Admitting: Neurology

## 2016-11-07 VITALS — BP 132/76 | HR 70 | Resp 16 | Ht 70.0 in | Wt 209.0 lb

## 2016-11-07 DIAGNOSIS — G47411 Narcolepsy with cataplexy: Secondary | ICD-10-CM

## 2016-11-07 DIAGNOSIS — R569 Unspecified convulsions: Secondary | ICD-10-CM | POA: Diagnosis not present

## 2016-11-07 NOTE — Patient Instructions (Addendum)
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  We will try to get records from Dr. Priscille LovelessHammond's office. I will review your sleep study results. Please call us in a couple of days regarding your sleep study records.

## 2016-11-07 NOTE — Progress Notes (Signed)
Subjective:    Patient ID: Brad Walker is a 26 y.o. male.  HPI     Huston FoleySaima Maui Britten, MD, PhD [facility] [address], [address] [address] [place], [place] [id number]  Dear Brad Walker,   I saw your patient, Brad Walker, upon your kind request in my clinic today for initial consultation of his sleep disorder, in particular reevaluation for narcolepsy with cataplexy. The patient is unaccompanied today. 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 Symptoms started when he was a sophomore in college. He started having what he calls drop attacks which would be sudden smaller muscle jerks or weakness episodes or actual physical collapse. He was often sleep deprived. He did not have any overwhelming sense of sleepiness but did have extended sleep at times and difficulty waking up in the mornings with sleeping through alarms, even fire alarm. He does not have a history of being a sleepy child. He has an older brother and had a twin brother, who passed away at age 26 secondary to car accident. He does have a family history of seizures, his father had a grand mal seizure and his twin brother had at least one grand mal seizure from what I understand. The patient has had episodes of loss of consciousness and lack of recollection after being out, this could last a few minutes to 10 minutes at a time, he has had involuntary prolonged sleep episodes. From his drop attacks he has had injuries including hitting his head and splitting his right eyebrow. He had sleep study and nap study testing some 4 or 5 years ago under [place] [facility] [place], we will try to get records from his sleep study and nap study testing. He is currently not on any medication but briefly tried Nuvigil and Effexor,  stopped those after a few weeks or a month as he did not find them effective. He does have a history of drinking alcohol sometimes excessively, not daily but often more heavier on weekends.  Prior sleep study results are not available for my review today. I reviewed your office note from 10/23/2016. He presented with a history of witnessed seizure event. He had an EEG in the interim on 10/31/2016 which showed normal findings for the awake and sleep states. He had a head CT without contrast on 08/18/2017 with normal findings, he is scheduled for a brain MRI tomorrow. He has been told not to drive for 6 months. 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 He quit using cocaine and marijuana in January 2018. He snores very little or mildly per GF. His father has OSA, no family history of narcolepsy. He goes to bed around 11 but it takes him sometimes hours to fall asleep. His normal wake time is 7 AM and he is sluggish to wake up but does not recall any dreams at night. He does report dreaming during naps. He can take extended if time allows. He has a family history of tremor in his older brother and father. He has a mild hand tremor himself. He is currently not on any medications. His Epworth sleepiness score is 13 out of 24, fatigue score is 54 out of 63. He denies any  sleep paralysis or hypnagogic or hypnopompic hallucinations.   His Past Medical History Is Significant For: Past Medical History:  Diagnosis Date  . Narcolepsy   . Seizures (HCC) 10/23/2016    His Past Surgical History Is Significant For: Past Surgical History:  Procedure Laterality Date  . SHOULDER SURGERY Right     His Family History Is Significant For: Family History  Problem Relation Age of Onset  . Breast cancer Mother   . Diabetes Father   . Hypertension Father   . Depression Father   . Seizures Father   . Seizures Brother     His Social  History Is Significant For: Social History   Social History  . Marital status: Single    Spouse name: N/A  . Number of children: 0  . Years of education: college   Occupational History  . [employer]    Social History Main Topics  . Smoking status: Never Smoker  . Smokeless tobacco: Never Used  . Alcohol use Yes     Comment: No drinks since seizure (10/23/16)  . Drug use: No     Comment: Quit Dec 2017- Marijuana  . Sexual activity: Not Asked   Other Topics Concern  . None   Social History Narrative   Lives with two roommates   Caffeine use: Soda daily    His Allergies Are:  No Known Allergies:   His Current Medications Are:  No outpatient encounter prescriptions on file as of 11/07/2016.   No facility-administered encounter medications on file as of 11/07/2016.   :  Review of Systems:  Out of a complete 14 point review of systems, all are reviewed and negative with the exception of these symptoms as listed below:  Review of Systems  Neurological:       Patient states that he was diagnosed with Narcolepsy after a sleep study that was done 4-5 years ago.  Patient has trouble falling asleep at night, wakes up feeling tired, daytime fatigue, may take a nap.    Epworth Sleepiness Scale 0= would never doze 1= slight chance of dozing 2= moderate chance of dozing 3= high chance of dozing  Sitting and reading:2 Watching TV:1 Sitting inactive in a public place (ex. Theater or meeting):1 As a passenger in a car for an hour without a break:2 Lying down to rest in the afternoon:3 Sitting and talking to someone:2 Sitting quietly after lunch (no alcohol):2 In a car, while stopped in traffic:0 Total: 13 Objective:  Neurologic Exam  Physical Exam Physical Examination:   Vitals:   11/07/16 1338  BP: 132/76  Pulse: 70  Resp: 16    General Examination: The patient is a very pleasant 26 y.o. male in no acute distress. He appears well-developed and well-nourished and well  groomed.   HEENT: Normocephalic, atraumatic, pupils are equal, round and reactive to light and accommodation. Extraocular tracking is good without limitation to gaze excursion or nystagmus noted. Normal smooth pursuit is noted. Hearing is grossly intact. Face is symmetric with normal facial animation and normal facial sensation. Speech is clear with no dysarthria noted. There is no hypophonia. There is no lip, neck/head, jaw or voice tremor. Neck is supple with full range of passive and active motion. There are no carotid bruits on auscultation. Oropharynx exam reveals: mild mouth dryness, adequate dental hygiene and mild airway crowding, due to larger uvula, thicker soft palate. Mallampati is class I. Tongue protrudes centrally and palate elevates symmetrically. Tonsils are absent. Neck size is 16 3/8 inches.  Chest: Clear to auscultation without wheezing, rhonchi or crackles noted.  Heart: S1+S2+0, regular and normal without murmurs, rubs or gallops noted.   Abdomen: Soft, non-tender and non-distended with normal bowel sounds appreciated on auscultation.  Extremities: There is no pitting edema in the distal lower extremities bilaterally. Pedal pulses are intact.  Skin: Warm and dry without trophic changes noted.  Musculoskeletal: exam reveals no obvious joint deformities, tenderness or joint swelling or erythema.   Neurologically:  Mental status: The patient is awake, alert and oriented in all 4 spheres. His immediate and remote memory, attention, language skills and fund of knowledge are appropriate. There is no evidence of aphasia, agnosia, apraxia or anomia. Speech is clear with normal prosody and enunciation. Thought process is linear. Mood is normal and affect is normal.  Cranial nerves II - XII are as described above under HEENT exam. In addition: shoulder shrug is normal with equal shoulder height noted. Motor exam: Normal bulk, strength and tone is noted. There is no drift, resting  tremor or rebound. He has a very mild bilateral upper extremity postural and action tremor, no intention tremor, no resting tremor. Romberg is negative. Reflexes are 1-2+ throughout. Fine motor skills and coordination: intact with normal finger taps, normal hand movements, normal rapid alternating patting, normal foot taps and normal foot agility.  Cerebellar testing: No dysmetria or intention tremor on finger to nose testing. Heel to shin is unremarkable bilaterally. There is no truncal or gait ataxia.  Sensory exam: intact to light touch in the upper and lower extremities.  Gait, station and balance: He stands easily. No veering to one side is noted. No leaning to one side is noted. Posture is age-appropriate and stance is narrow based. Gait shows normal stride length and normal pace. No problems turning are noted. Tandem walk is unremarkable.   Assessment and Plan:  In summary, Kamdon Reisig is a very pleasant 26 y.o.-year old male with an underlying medical history of seizure disorder, history of cocaine abuse, and overweight state, who reports a prior diagnosis of narcolepsy with cataplexy. He may have intermittent cataplectic attacks but loss of consciousness is unusual. He may have a seizure disorder. He has of hip a history of seizures. His history is not telltale for narcolepsy, and his history is complicated by his history of drug abuse and sleep deprivation, as well as alcohol use on a regular basis. He is advised to refrain from taking any illicit drugs and limit or eliminate his alcohol use. I would like to review his sleep study results, we will try to get records this week, he is advised to call us back in a couple of days to recheck if we received records from [facility]. If necessary, I suggested we may have to bring him back for sleep study and Testing to verify his prior diagnosis of narcolepsy and to further investigate his sleep disorder. He is scheduled for a brain MRI  tomorrow, physical exam and neurological exam is nonfocal with the exception of mild signs of essential tremor. At this juncture, will await test results and review them. I will see him back after I have had a chance to review his test results or if we proceed with further sleep studies testing here.  Thank you very much for allowing me to participate in the care of this nice patient. If I can be of any further assistance to you please do not hesitate to talk to me.  Sincerely,   Huston Foley, MD, PhD

## 2016-11-08 ENCOUNTER — Ambulatory Visit (INDEPENDENT_AMBULATORY_CARE_PROVIDER_SITE_OTHER): Payer: BC Managed Care – PPO

## 2016-11-08 DIAGNOSIS — R569 Unspecified convulsions: Secondary | ICD-10-CM | POA: Diagnosis not present

## 2016-11-08 MED ORDER — GADOPENTETATE DIMEGLUMINE 469.01 MG/ML IV SOLN: 20.0000 mL | Freq: Once | INTRAVENOUS | Status: AC | PRN

## 2016-11-10 ENCOUNTER — Telehealth: Payer: Self-pay | Admitting: Neurology

## 2016-11-10 NOTE — Telephone Encounter (Signed)
Called and spoke to pt about MRI results again per CW,MD note. Advised him to call if he has any more seizure events.   He stated he is waiting on return call from Dr Frances FurbishAthar. Wanted to make sure there was nothing else he needed to do. Advised him that Dr Frances FurbishAthar will return call. If any more questions arise for Dr Anne HahnWillis he can call. He verbalized understanding.

## 2016-11-10 NOTE — Telephone Encounter (Signed)
I called patient to ask him if he had prior sleep study and if so bring it to appt. He did not. Had him fill out release form at appt.

## 2016-11-10 NOTE — Telephone Encounter (Signed)
I called patient. MRI the brain was unremarkable, he is to contact our office if another seizure event is noted.  MRI brain 11/09/16:   IMPRESSION:  This MRI of the brain with and without contrast shows the following: 1.    Brain parenchyma appears normal before and after contrast. 2.    Chronic left maxillary sinusitis. 3.     There are no acute findings and there is a normal enhancement pattern.

## 2016-11-10 NOTE — Telephone Encounter (Signed)
Patient called office returning Dr. Willis' call.  Please call °

## 2016-11-10 NOTE — Telephone Encounter (Signed)
Please offer patient appt to discuss Rx options for narcolepsy and review of prior study results. Please hold on to the faxed results we received. I believe new sleep study testing is not necessary at this point but need to sit down face to face for Rx options.  Please get patient info and form pulled for [name].  If no FU appt slot, may offer 8 AM appt.

## 2016-11-10 NOTE — Telephone Encounter (Signed)
Patient called office in reference to prior sleep study results to see if he is needing to schedule another study.  Please call

## 2016-11-13 NOTE — Telephone Encounter (Signed)
LM for patient to call us back and make f/u appt with Dr. Frances FurbishAthar to discuss prior sleep study and treatment options. Patient can take an 8am slot.

## 2016-11-15 NOTE — Telephone Encounter (Signed)
Patient is returning your call.  

## 2016-11-16 NOTE — Telephone Encounter (Signed)
I spoke to patient and was able to get him in Monday morning at 8am

## 2016-11-20 ENCOUNTER — Ambulatory Visit (INDEPENDENT_AMBULATORY_CARE_PROVIDER_SITE_OTHER): Payer: BC Managed Care – PPO | Admitting: Neurology

## 2016-11-20 ENCOUNTER — Encounter: Payer: Self-pay | Admitting: Neurology

## 2016-11-20 VITALS — BP 112/70 | HR 80 | Resp 16 | Ht 70.0 in | Wt 210.0 lb

## 2016-11-20 DIAGNOSIS — Z87898 Personal history of other specified conditions: Secondary | ICD-10-CM | POA: Diagnosis not present

## 2016-11-20 DIAGNOSIS — G47411 Narcolepsy with cataplexy: Secondary | ICD-10-CM

## 2016-11-20 DIAGNOSIS — F1411 Cocaine abuse, in remission: Secondary | ICD-10-CM

## 2016-11-20 NOTE — Progress Notes (Signed)
Subjective:    Patient ID: Brad Walker is a 26 y.o. male.  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 The patient is unaccompanied today. I first met him on 11/07/2016 at the request of Dr. Jannifer Franklin, at which time the patient reported a prior diagnosis of narcolepsy with cataplexy some 4-5 years prior. Sleep study results from his previous sleep specialist were not available at the time but we receive them in the interim. He presents for discussion of treatment options and review of his previous sleep study results. At the time of her first visit he was advised to refrain from any illicit drug use.  Today, 11/20/2016 (all dictated new, as well as above notes, some dictation done in note pad or Word, outside of chart, may appear as copied):   He reports seeing a therapist on a weekly basis. I told him that we would need to do a drug screen today. He reports that he well have a positive drug screen. He says that he had a relapse within the last month with cocaine. He had a brain MRI with and without contrast the interim on 11/09/2016 which I reviewed: Brain parenchyma appears normal before and after contrast. Chronic left maxillary sinusitis. He had a baseline sleep study and [place] on 11/21/2012 which showed a sleep latency of 14 went 5 minutes, 206 minutes and wake after sleep onset, REM latency 6 minutes. Sleep efficiency was 63.8%. He had a decreased percentage of stage II sleep at 25.5%, slow-wave sleep at 12.3% and REM sleep 19%. He had a total AHI of 4.62 per hour. Supine AHI was 4.97 per hour, REM AHI was 4.98 per hour. Average oxygen saturation was 98%, nadir was 92%. His PLM index was 2.5 per hour. He had a next a MS LT on 11/22/2012 with a mean sleep latency of 6.3  minutes for 5 and 5 REM onset naps. His sleep studies were interpreted by Dr. Riki Sheer.  The patient's allergies, current medications, family history, past medical history, past social history, past surgical history and problem list were reviewed and updated as appropriate.   Previously (copied from previous notes for reference):   11/07/2016: He was diagnosed with narcolepsy with cataplexy some 4 or 5 years ago. Symptoms started when he was a sophomore in college. He started having what he calls drop attacks which would be sudden smaller muscle jerks or weakness episodes or actual physical collapse. He was often sleep deprived. He did not have any overwhelming sense of sleepiness but did have extended sleep at times and difficulty waking up in the mornings with sleeping through alarms, even fire alarm. He does not have a history of being a sleepy child. He has an older brother and had a twin brother, who passed away at age 69 secondary to car accident. He does have a family history of seizures, his father had a grand mal seizure and his twin brother had at least one grand mal seizure from what I understand. The patient has had episodes of loss of consciousness and lack of recollection after being out, this could last a few minutes to 10 minutes at a time, he has had involuntary prolonged sleep episodes. From his drop attacks he has had injuries including hitting his head and splitting his right eyebrow. He had  sleep study and nap study testing some 4 or 5 years ago under Dr. Riki Sheer out of [facility], we will try to get records from his sleep study and nap study testing. He is currently not on any medication but briefly tried Nuvigil and Effexor, stopped those after a few weeks or a month as he did not find them effective. He does have a history of drinking alcohol sometimes excessively, not daily but often more heavier on weekends.  Prior sleep study results are not available  for my review today. I reviewed your office note from 10/23/2016. He presented with a history of witnessed seizure event. He had an EEG in the interim on 10/31/2016 which showed normal findings for the awake and sleep states. He had a head CT without contrast on 08/18/2017 with normal findings, he is scheduled for a brain MRI tomorrow. He has been told not to drive for 6 months. 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 He quit using cocaine and marijuana in January 2018. He snores very little or mildly per GF. His father has OSA, no family history of narcolepsy. He goes to bed around 11 but it takes him sometimes hours to fall asleep. His normal wake time is 7 AM and he is sluggish to wake up but does not recall any dreams at night. He does report dreaming during naps. He can take extended if time allows. He has a family history of tremor in his older brother and father. He has a mild hand tremor himself. He is currently not on any medications. His Epworth sleepiness score is 13 out of 24, fatigue score is 54 out of 63. He denies any sleep paralysis or hypnagogic or hypnopompic hallucinations.   His Past Medical History Is Significant For: Past Medical History:  Diagnosis Date  . Narcolepsy   . Seizures ([place]) 10/23/2016    His Past Surgical History Is Significant For: Past Surgical History:  Procedure Laterality Date  . SHOULDER SURGERY Right     His Family History Is Significant For: Family History  Problem Relation Age of Onset  . Breast cancer Mother   . Diabetes Father   . Hypertension Father   . Depression Father   . Seizures Father   . Seizures Brother     His Social History Is Significant For: Social History   Social History  . Marital status: Single    Spouse name: N/A  . Number of children: 0  . Years of education: college   Occupational History  . [employer]    Social History Main Topics   . Smoking status: Never Smoker  . Smokeless tobacco: Never Used  . Alcohol use Yes     Comment: No drinks since seizure (10/23/16)  . Drug use: No     Comment: Quit Dec 2017- Marijuana  . Sexual activity: Not Asked   Other Topics Concern  . None   Social History Narrative   Lives with two roommates   Caffeine use: Soda daily    His Allergies Are:  No Known Allergies:   His Current Medications Are:  No outpatient encounter prescriptions on file as of 11/20/2016.   Facility-Administered Encounter Medications as of 11/20/2016  Medication  . gadopentetate dimeglumine (MAGNEVIST) injection 20 mL  :  Review of Systems:  Out of a complete 14 point review of systems, all are reviewed and negative with the  exception of these symptoms as listed below: Review of Systems  Neurological:       Patient is here to discuss his prior sleep study and treatment options.     Objective:  Neurologic Exam  Physical Exam Physical Examination:   Vitals:   11/20/16 0816  BP: 112/70  Pulse: 80  Resp: 16    General Examination: The patient is a very pleasant 26 y.o. male in no acute distress. He appears well-developed and well-nourished and adequately groomed.   HEENT: Normocephalic, atraumatic, pupils are equal, round and reactive to light and accommodation. Funduscopic exam is normal with sharp disc margins noted. Extraocular tracking is good without limitation to gaze excursion or nystagmus noted. Normal smooth pursuit is noted. Hearing is grossly intact. Tympanic membranes are clear bilaterally. Face is symmetric with normal facial animation and normal facial sensation. Speech is clear with no dysarthria noted. There is no hypophonia. There is no lip, neck/head, jaw or voice tremor. Neck is supple with full range of passive and active motion. There are no carotid bruits on auscultation. Oropharynx exam reveals: mild mouth dryness, mild pharyngeal erythema, mildly hoarse voice. Adequate dental  hygiene. Mild airway crowding. Mallampati is class I. Tongue protrudes centrally and palate elevates symmetrically. Tonsils absent.    Chest: Clear to auscultation without wheezing, rhonchi or crackles noted.  Heart: S1+S2+0, regular and normal without murmurs, rubs or gallops noted.   Abdomen: Soft, non-tender and non-distended with normal bowel sounds appreciated on auscultation.  Extremities: There is no pitting edema in the distal lower extremities bilaterally. Pedal pulses are intact.  Skin: Warm and dry without trophic changes noted.   Mussculoskeletal: exam reveals no obvious joint deformities, tenderness or joint swelling or erythema.   Neurologically:  Mental status: The patient is awake, alert and oriented in all 4 spheres. His immediate and remote memory, attention, language skills and fund of knowledge are appropriate. There is no evidence of aphasia, agnosia, apraxia or anomia. Speech is clear with normal prosody and enunciation. Thought process is linear. Mood is normal and affect is blunted.  Cranial nerves II - XII are as described above under HEENT exam. In addition: shoulder shrug is normal with equal shoulder height noted. Motor exam: Normal bulk, strength and tone is noted. There is no drift, tremor or rebound. Romberg is negative. Reflexes are 1-2+ throughout. Fine motor skills and coordination: intact.   Cerebellar testing: No dysmetria or intention tremor. There is no truncal or gait ataxia.  Sensory exam: intact to light touch in the upper and lower extremities.  Gait, station and balance: He stands easily. No veering to one side is noted. No leaning to one side is noted. Posture is age-appropriate and stance is narrow based. Gait shows normal stride length and normal pace. No problems turning are noted. Tandem walk is unremarkable. Intact toe and heel stance is noted.               Assessment and Plan:   In summary, Belen Zwahlen is a very pleasant 26 y.o.-year old male  With an underlying medical history of depression, substance abuse, seizure disorder and overweight state, who presents for follow-up consultation of his prior diagnosis of narcolepsy with cataplexy. We discussed test results from his sleep study testing in March 2014 which were supportive of narcolepsy. His history is supportive for narcolepsy with cataplexy of several years duration. I talked to the patient at length about utilizing [name] for treatment of narcolepsy with cataplexy. We talked in particular  about the risks and ramifications of taking [name], its benefits, expectations and interactions, black box warning as well. He is particularly advised to refrain from taking all illicit drugs and eliminated alcohol use, he cannot take [name] in the context of taking illicit drugs and that includes marijuana and of course cocaine. He reports a relapse with cocaine within the past month. We will do a UDS today and not start [name] until his UDS is clean. He is furthermore advised that he cannot drink alcohol with [name]. We talked about exacerbation of depression and wi suicidal ideation. This has been described with [name] use. He is in regular counseling and we may involve a psychiatrist if needed. As of right now he does not feel significantly depressed and feels stable mood wise. We talked about his recent EEG test results and MRI brain results as well. Physical exam and neurological exam are stable. I provided information including A/V information on USB stick for patients who aren't working on [name]. We signed a form and will submit to his insurance for authorization. We will not start [name] until we have results and the UDS and a UDS is clean. He understood. I answered all his questions and encouraged him to call or email for any interim questions or concerns. I will see him back in a couple of months, sooner as needed.  I spent 30 minutes in total face-to-face time with the patient, more than 50% of which was  spent in counseling and coordination of care, reviewing test results, reviewing medication and discussing or reviewing the diagnosis of narcolepsy with cataplexy, its prognosis and treatment options. Pertinent laboratory and imaging test results that were available during this visit with the patient were reviewed by me and considered in my medical decision making (see chart for details).

## 2016-11-20 NOTE — Patient Instructions (Addendum)
As discussed, we will start the process of getting you approved for [name].   As discussed, [name] has to be taken with very mindful caution: Taking [name] correctly is key. This means, take it only when you are fully ready to fall asleep, while in bed and refrain from doing any other activities, even brushing  your teeth after taking your first dose. The second dose will be about 2-1/2-4 hours after his first dose. You can go to the bathroom before your 2nd dose. Take your first dose, when actually IN BED, ready to sleep. No sitting up in bed, NO reading, NO using the cell phone or computer, NO getting up to use the bathroom. Take care of everything BEFORE sleep time. Try NOT to skip the second dose as the [name] is not going to stay in your system long enough with only one dose. Do not drink alcohol with [name]. If you do drink Alcohol, you cannot take your [name] doses that night.   [name] can cause increase in depression and even suicidal thoughts or actions. Keep your counselor informed and we may need to involve a psychiatrist if needed.   You must not take ANY illicit drugs at this time, this includes Marijuana, and of course cocaine.   We will have to screen you for illicit drugs. We will NOT start [name] if your drug screen comes back positive for any illicit drugs.  We will call you with the test results. We will not start [name] quite yet.

## 2016-11-30 ENCOUNTER — Telehealth: Payer: Self-pay | Admitting: Neurology

## 2016-11-30 DIAGNOSIS — F39 Unspecified mood [affective] disorder: Secondary | ICD-10-CM

## 2016-11-30 DIAGNOSIS — G47411 Narcolepsy with cataplexy: Secondary | ICD-10-CM

## 2016-11-30 DIAGNOSIS — F1910 Other psychoactive substance abuse, uncomplicated: Secondary | ICD-10-CM

## 2016-11-30 NOTE — Telephone Encounter (Signed)
We have preliminary test results from his urine drug screen which were negative as of 11/28/16. We will go ahead and process the order for [name] and the pharmacy will be in touch with him about delivery of his medication. Given his history of drug use, I would like for him to agree to random drug screens from time to time with urine testing in our office. This is of course for his own safety and wellbeing. Please relay to patient.  Keep FU in May.

## 2016-11-30 NOTE — Telephone Encounter (Signed)
Patient called office in reference to lab results.  Please call °

## 2016-11-30 NOTE — Telephone Encounter (Signed)
I LM for patient to call back. I need to give him the information below. I have faxed in the order for [name] to the [name] [facility] program to go ahead and get the process started.

## 2016-11-30 NOTE — Telephone Encounter (Signed)
How would you like to proceed? 

## 2016-12-01 LAB — DRUG SCREEN 12+ALCOHOL+CRT, UR
Amphetamines, Urine: NEGATIVE ng/mL
BENZODIAZ UR QL: NEGATIVE ng/mL
Barbiturate: NEGATIVE ng/mL
Cannabinoids: NEGATIVE ng/mL
Cocaine (Metabolite): NEGATIVE ng/mL
Creatinine, Urine: 58.1 mg/dL (ref 20.0–300.0)
Ethanol U, Quan: 0.6 %
Meperidine: NEGATIVE ng/mL
Methadone: NEGATIVE ng/mL
OPIATE SCREEN URINE: NEGATIVE ng/mL
Oxycodone/Oxymorphone, Urine: NEGATIVE ng/mL
Phencyclidine: NEGATIVE ng/mL
Propoxyphene: NEGATIVE ng/mL
Tramadol: NEGATIVE ng/mL

## 2016-12-01 NOTE — Telephone Encounter (Signed)
Pt has called for the results of the testing, I informed him I would have a message sent asking he be contacted

## 2016-12-04 NOTE — Telephone Encounter (Signed)
I spoke to patient and he is aware of all information below. He voiced that he does agree to random drug screens. He reports that [name] called him on Saturday and he has electronically signed off on what he need to sign. He will call back if he has any further questions.

## 2016-12-06 ENCOUNTER — Telehealth: Payer: Self-pay

## 2016-12-06 ENCOUNTER — Other Ambulatory Visit: Payer: Self-pay

## 2016-12-06 NOTE — Telephone Encounter (Signed)
PA started in [place], [place]: [id number]

## 2016-12-07 NOTE — Telephone Encounter (Signed)
Per status in CoverMyMeds, [name] approved from 12/06/2016-12/06/2017

## 2016-12-12 NOTE — Telephone Encounter (Signed)
I received another pa request for xyrem. I called xyrem SDS, they ran the claim, and it still says that a pa is required.   I completed pa, sent to [facility], should have a determination in 1-3 business days.

## 2016-12-13 NOTE — Telephone Encounter (Signed)
PA was approved by [facility] from 12/12/2016 to 12/12/2017.  PA# [id number]  [name] SDS made aware.

## 2016-12-19 NOTE — Telephone Encounter (Signed)
Belenda CruiseMolli Knock to give verbal to ship [name].  Concerning his Hx of Depression and illicit drug use: I would like for patient to see a psychiatrist too. I will make a referral.  There is a chance that [name] could exacerbate depression/anxiety/mood disorder and also a relapse with drug use. I would feel it is safer for patient to also see a mental health provider in parallel. Please let patient know, that I have given the Okay for [name] to be shipped and that I would like to have him seen by mental health as soon as possible to get him plugged in.   Annabelle Harman, please expedite referral, thx.

## 2016-12-19 NOTE — Telephone Encounter (Signed)
Pt returned my call. He advised me that he sees Peggyann Shoals regularly at [facility]. She is a Veterinary surgeon. Pt says that if needed, he will see a psychiatrist but he does see the counselor regularly. Pt says that his mom is also coming to stay with him while he is starting xyrem. Pt wants to know if this is acceptable.

## 2016-12-19 NOTE — Telephone Encounter (Signed)
I called pt to discuss. No answer, left a message for him to call me back.  I also called xyrem, but was told I would be on hold for over 5 minutes. Will try again later.

## 2016-12-19 NOTE — Telephone Encounter (Signed)
[facility] [phone] opt 3 opt 4 called for approval to ship the medication given the pt's history of depression and cocaine use. Please call

## 2016-12-19 NOTE — Addendum Note (Signed)
Addended by: Huston Foley on: 12/19/2016 01:39 PM   Modules accepted: Orders

## 2016-12-19 NOTE — Telephone Encounter (Signed)
I like the idea that his mom is staying with him (makes me feel better to know, someone is there for him). As long as he is monitored by his counselor, he does not need a psychiatrist and we should be good to go with the xyrem. Keep appt with me on 02/01/17.

## 2016-12-19 NOTE — Telephone Encounter (Signed)
I called pt and advised him that Dr. Frances Furbish is agreeable to pt continuing to see his counselor and does not need a psychiatrist at this time, and that Dr. Frances Furbish is glad pt's mom will be staying with him during xyrem start. Pt knows to keep the 02/01/2017 appt.  I called xyrem, left a message with the pharmacy line that give them approve to ship xyrem.

## 2016-12-20 NOTE — Telephone Encounter (Signed)
Angie, pharmacist at [facility] returned my call and and I gave her the message that Dr. Frances Furbish is agreeable to xyrem being shipped and knows about pt's hx of depression and illicit drug use.

## 2017-01-19 ENCOUNTER — Encounter (HOSPITAL_COMMUNITY): Payer: Self-pay

## 2017-01-19 ENCOUNTER — Emergency Department (HOSPITAL_COMMUNITY)
Admission: EM | Admit: 2017-01-19 | Discharge: 2017-01-19 | Disposition: A | Discharge status: Home / Self Care | Payer: BC Managed Care – PPO | Attending: Emergency Medicine | Admitting: Emergency Medicine

## 2017-01-19 ENCOUNTER — Emergency Department (HOSPITAL_COMMUNITY): Payer: BC Managed Care – PPO

## 2017-01-19 ENCOUNTER — Telehealth: Payer: Self-pay | Admitting: Neurology

## 2017-01-19 DIAGNOSIS — Y939 Activity, unspecified: Secondary | ICD-10-CM | POA: Insufficient documentation

## 2017-01-19 DIAGNOSIS — Z79899 Other long term (current) drug therapy: Secondary | ICD-10-CM | POA: Diagnosis not present

## 2017-01-19 DIAGNOSIS — R569 Unspecified convulsions: Secondary | ICD-10-CM | POA: Insufficient documentation

## 2017-01-19 DIAGNOSIS — S0990XA Unspecified injury of head, initial encounter: Secondary | ICD-10-CM | POA: Diagnosis present

## 2017-01-19 DIAGNOSIS — S0081XA Abrasion of other part of head, initial encounter: Secondary | ICD-10-CM | POA: Insufficient documentation

## 2017-01-19 DIAGNOSIS — Y999 Unspecified external cause status: Secondary | ICD-10-CM | POA: Diagnosis not present

## 2017-01-19 DIAGNOSIS — W1830XA Fall on same level, unspecified, initial encounter: Secondary | ICD-10-CM | POA: Insufficient documentation

## 2017-01-19 DIAGNOSIS — Y929 Unspecified place or not applicable: Secondary | ICD-10-CM | POA: Diagnosis not present

## 2017-01-19 LAB — BASIC METABOLIC PANEL
Anion gap: 8 (ref 5–15)
BUN: 7 mg/dL (ref 6–20)
CO2: 27 mmol/L (ref 22–32)
Calcium: 9.8 mg/dL (ref 8.9–10.3)
Chloride: 101 mmol/L (ref 101–111)
Creatinine, Ser: 1.12 mg/dL (ref 0.61–1.24)
GFR calc Af Amer: >60 mL/min (ref >60)
GFR calc non Af Amer: >60 mL/min (ref >60)
Glucose, Bld: 91 mg/dL (ref 65–99)
Potassium: 3.5 mmol/L (ref 3.5–5.1)
Sodium: 136 mmol/L (ref 135–145)

## 2017-01-19 LAB — CBG MONITORING, ED: Glucose-Capillary: 96 mg/dL (ref 65–99)

## 2017-01-19 MED ORDER — LEVETIRACETAM 500 MG PO TABS: 500.0000 mg | ORAL_TABLET | Freq: Two times a day (BID) | ORAL | 3 refills | Status: DC

## 2017-01-19 NOTE — Telephone Encounter (Signed)
The patient went to the ER today following a seizure. He continues to abuse alcohol and cocaine.   I will place him on keppra 500 mg twice a day.

## 2017-01-19 NOTE — Discharge Instructions (Signed)
Please follow-up with Dr. Anne HahnWillis as soon as possible and call his office on Monday to make an appointment. Dr. Anne HahnWillis has called in your medication to begin. Make sure not to drive or operate machinery. Do not swim or climb to high heights on the ladder or other apparatus. Please return to the emergency department if he develop any new or worsening symptoms.

## 2017-01-19 NOTE — ED Notes (Signed)
Pt states that seizures are "directly linked to narcolepsy, catoplexy, etoh, or drug use"

## 2017-01-19 NOTE — ED Notes (Signed)
Last seizure, May 13th, prior to that March 24th, April 22nd.

## 2017-01-19 NOTE — ED Triage Notes (Signed)
Per Pt, Pt has a hx of seizures and was noted to have one this morning. Pt recalls be restless thins morning and unable to sleep. He got up to take a shower and remembers having a bout of tremors. Pt went back to bed and woke up with abrasion on forehead an no recollection of what had happened. Father reports hearing his son thrashing and found son on the floor next to the bedside table with an abrasion on his forehead. Pt was slightly combative and hard to reason with at the time and then was helped back into the bed.

## 2017-01-19 NOTE — ED Provider Notes (Signed)
[facility]-EMERGENCY DEPT Provider Note   CSN: [id number] Arrival date & time: 01/19/17  1230     History   Chief Complaint Chief Complaint  Patient presents with  . Seizures  . Fall    HPI Brad Walker is a 26 y.o. male with history of alcohol and cocaine abuse, narcolepsy, and seizures who presents following a seizure today. Patient's father witnessed the end of the patient's seizures on the floor shaking all extremities. Found him with an abrasion on his forehead. Patient reports he got up to take a shower members having tremors prior to the seizure started. Patient did have a postictal state around 10-15 minutes where he was feeling out of it as not making sense. Patient complains of mild headache at this time, but no other complaints. He denies any numbness or tingling. He denies any chest pain, shortness of breath, abdominal pain, nausea, vomiting, urinary symptoms. Patient reports he hasn't used alcohol in 2 weeks and last used cocaine on 01/13/2017. He is in counseling to attempt sobriety.  HPI  Past Medical History:  Diagnosis Date  . Narcolepsy   . Seizures (HCC) 10/23/2016    Patient Active Problem List   Diagnosis Date Noted  . Seizures (HCC) 10/23/2016    Past Surgical History:  Procedure Laterality Date  . SHOULDER SURGERY Right        Home Medications    Prior to Admission medications   Medication Sig Start Date End Date Taking? Authorizing Provider  levETIRAcetam (KEPPRA) 500 MG tablet Take 1 tablet (500 mg total) by mouth 2 (two) times daily. 01/19/17   York SpanielWillis, Charles K, MD    Family History Family History  Problem Relation Age of Onset  . Breast cancer Mother   . Diabetes Father   . Hypertension Father   . Depression Father   . Seizures Father   . Seizures Brother     Social History Social History  Substance Use Topics  . Smoking status: Never Smoker  . Smokeless tobacco: Never Used  . Alcohol use Yes     Comment: No drinks since seizure  (10/23/16)     Allergies   Patient has no known allergies.   Review of Systems Review of Systems  Constitutional: Negative for chills and fever.  HENT: Negative for facial swelling and sore throat.   Respiratory: Negative for shortness of breath.   Cardiovascular: Negative for chest pain.  Gastrointestinal: Negative for abdominal pain, nausea and vomiting.  Genitourinary: Negative for dysuria.  Musculoskeletal: Negative for back pain.  Skin: Positive for wound. Negative for rash.  Neurological: Positive for seizures and headaches (mild).  Psychiatric/Behavioral: The patient is not nervous/anxious.      Physical Exam Updated Vital Signs BP 136/78 (BP Location: Right Arm)   Pulse 81   Temp 98 F (36.7 C) (Oral)   Resp 19   Ht 5\' 10"  (1.778 m)   Wt 205 lb (93 kg)   SpO2 97%   BMI 29.41 kg/m   Physical Exam  Constitutional: He appears well-developed and well-nourished. No distress.  HENT:  Head: Normocephalic and atraumatic.  Mouth/Throat: Oropharynx is clear and moist. No oropharyngeal exudate.  Eyes: Conjunctivae and EOM are normal. Pupils are equal, round, and reactive to light. Right eye exhibits no discharge. Left eye exhibits no discharge. No scleral icterus.  Neck: Normal range of motion. Neck supple. No thyromegaly present.  Cardiovascular: Normal rate, regular rhythm, normal heart sounds and intact distal pulses.  Exam reveals no gallop and no  friction rub.   No murmur heard. Pulmonary/Chest: Effort normal and breath sounds normal. No stridor. No respiratory distress. He has no wheezes. He has no rales.  Abdominal: Soft. Bowel sounds are normal. He exhibits no distension. There is no tenderness. There is no rebound and no guarding.  Musculoskeletal: He exhibits no edema.  Lymphadenopathy:    He has no cervical adenopathy.  Neurological: He is alert. Coordination normal.  CN 3-12 intact; normal sensation throughout; 5/5 strength in all 4 extremities; equal  bilateral grip strength; no ataxia on finger to nose  Skin: Skin is warm and dry. No rash noted. He is not diaphoretic. No pallor.  Minor abrasion to forehead, mildly tender  Psychiatric: He has a normal mood and affect.  Nursing note and vitals reviewed.    ED Treatments / Results  Labs (all labs ordered are listed, but only abnormal results are displayed) Labs Reviewed  BASIC METABOLIC PANEL  CBG MONITORING, ED    EKG  EKG Interpretation None       Radiology Ct Head Wo Contrast  Result Date: 01/19/2017 CLINICAL DATA:  Seizure with scrape to mid forehead EXAM: CT HEAD WITHOUT CONTRAST TECHNIQUE: Contiguous axial images were obtained from the base of the skull through the vertex without intravenous contrast. COMPARISON:  MRI 11/08/2016, CT brain 10/19/2016 FINDINGS: Brain: No evidence of acute infarction, hemorrhage, hydrocephalus, extra-axial collection or mass lesion/mass effect. Vascular: No hyperdense vessel or unexpected calcification. Skull: Normal. Negative for fracture or focal lesion. Sinuses/Orbits: Mucosal thickening within the maxillary and ethmoid sinuses. No acute orbital abnormality. Other: None IMPRESSION: No CT evidence for acute intracranial abnormality. Paranasal sinus disease. Electronically Signed   By: Jasmine Pang M.D.   On: 01/19/2017 14:34    Procedures Procedures (including critical care time)  Medications Ordered in ED Medications - No data to display   Initial Impression / Assessment and Plan / ED Course  I have reviewed the triage vital signs and the nursing notes.  Pertinent labs & imaging results that were available during my care of the patient were reviewed by me and considered in my medical decision making (see chart for details).     Patient with seizure history, not on anti-convulsant, presents after seizure. BMP is unremarkable. CT head is negative. I spoke with patient's neurologist, Dr. Anne Hahn, who recommends initiation of Keppra at  this time. He will call in Keppra for patient. He will also try to move patient's follow-up appointment up sooner from his current appointment in July. Return precautions discussed with the patient. I also discussed seizure precaution and advised patient not to drive, swim, or climb to heights. He understands and agrees with plan. Patient vitals stable throughout ED course discharge in satisfactory condition. I discussed patient case with Dr. Fayrene Fearing who guided the patient's management and agrees with plan.   Final Clinical Impressions(s) / ED Diagnoses   Final diagnoses:  Seizure (HCC)    New Prescriptions New Prescriptions   LEVETIRACETAM (KEPPRA) 500 MG TABLET    Take 1 tablet (500 mg total) by mouth 2 (two) times daily.     Emi Holes, PA-C 01/19/17 1546    Rolland Porter, MD 01/19/17 1626

## 2017-01-23 ENCOUNTER — Telehealth: Payer: Self-pay | Admitting: Neurology

## 2017-01-23 NOTE — Telephone Encounter (Signed)
The interaction that I see listed has to with sedation effects, not increased seizures, I suspect that this patient has a higher risk with alcohol use with [name] rather than with Keppra.  I do not see that Keppra and [name] combination increases risk for seizures. It does increase risk for sedation.  The patient is on a relatively low dose of Keppra, I am not sure that this would result in significant side effects.

## 2017-01-23 NOTE — Telephone Encounter (Signed)
Mellody DanceKeith, would you review and consider changing his AED?  I would really like to try him on [name] for his narcolepsy Dx and there is apparently an interaction between [name] and Keppra. thx

## 2017-01-23 NOTE — Telephone Encounter (Signed)
Cat with [facility] called office in reference to patient [name].  States patient is now taking Keppra and there is an interaction with the two medications with increase of seizures. Please call

## 2017-01-24 NOTE — Telephone Encounter (Signed)
I agree with this.  Patient is at risk for increase in depression and combining alcohol and illicit drugs with the [name]. I have counseled him extensively on these issues.  Will proceed with [name]. Thanks for your input.

## 2017-01-24 NOTE — Telephone Encounter (Signed)
I called back the Pharmacist at the pharmacy and gave information below. She voiced understanding and they will ship the [name] out to the patient.

## 2017-01-30 ENCOUNTER — Telehealth: Payer: Self-pay | Admitting: Neurology

## 2017-01-30 NOTE — Telephone Encounter (Signed)
I spoke to Brad Walker. Gave him the information below. He states that he does not recall using in the last month but would have to ask his family to confirm. I advised him on what his last ED note states. He reported that he does not recall this.   He says that he started his 2nd shipment of [name] and is doing well on it. He plans to take tonight and will be at appointment tomorrow. He states that he is willing to take UDS.   I will let Dr. Frances FurbishAthar know.

## 2017-01-30 NOTE — Telephone Encounter (Signed)
Duplicate

## 2017-01-30 NOTE — Telephone Encounter (Signed)
Diana: Pls call patient: in light of recent Sz and cocaine use (last used 01/13/17, per ER records), will hold off on [name]. Please advise patient not to start [name], if he received shipment.  We will talk more tomorrow at appointment.   Please notify the [name] specialty pharmacy.   Will copy Dr. Anne HahnWillis on this.

## 2017-01-31 ENCOUNTER — Encounter: Payer: Self-pay | Admitting: Neurology

## 2017-01-31 ENCOUNTER — Ambulatory Visit (INDEPENDENT_AMBULATORY_CARE_PROVIDER_SITE_OTHER): Payer: BC Managed Care – PPO | Admitting: Neurology

## 2017-01-31 VITALS — BP 142/76 | HR 80 | Resp 16 | Ht 70.0 in | Wt 206.0 lb

## 2017-01-31 DIAGNOSIS — G47411 Narcolepsy with cataplexy: Secondary | ICD-10-CM

## 2017-01-31 DIAGNOSIS — G40909 Epilepsy, unspecified, not intractable, without status epilepticus: Secondary | ICD-10-CM | POA: Diagnosis not present

## 2017-01-31 DIAGNOSIS — F1911 Other psychoactive substance abuse, in remission: Secondary | ICD-10-CM

## 2017-01-31 DIAGNOSIS — Z87898 Personal history of other specified conditions: Secondary | ICD-10-CM | POA: Diagnosis not present

## 2017-01-31 NOTE — Patient Instructions (Signed)
I think you are not safe at this point to be on [name]. I believe you need optimization of her seizure disorder. He may need adjustment of her Keppra dosing. You report 6 total seizures thus far. You were just recently started on Keppra on 01/19/17 when you went to the emergency room. At this moment, please reduce your [name] to 3 g twice nightly for 1 week, then 2.25 g twice nightly for 1 week, then stop. He will see you in follow-up in a month with one of our nurse practitioners, we will keep monitoring your seizures.

## 2017-01-31 NOTE — Progress Notes (Signed)
Subjective:    Walker ID: Brad Walker is a 26 y.o. male.  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 Brad Walker is accompanied by Brad Walker mother today. I las saw him on 11/20/2016, at which time Brad Walker reported that Brad Walker was seeing a therapist on a weekly basis. I reviewed advised him that we would do a UDS. Brad Walker reported having had a relapse with cocaine within Brad last month. Of note, Brad Walker UDS was negative for cocaine at Brad time. We talked about Brad Walker brain MRI with and without contrast from 11/09/2016 which showed: Brain parenchyma appears normal before and after contrast. Chronic left maxillary sinusitis. Brad Walker had a baseline sleep study and [place] on 11/21/2012 which showed a sleep latency of 14 went 5 minutes, 206 minutes and wake after sleep onset, REM latency 6 minutes. Sleep efficiency was 63.8%. Brad Walker had a decreased percentage of stage II sleep at 25.5%, slow-wave sleep at 12.3% and REM sleep 19%. Brad Walker had a total AHI of 4.62 per hour. Supine AHI was 4.97 per hour, REM AHI was 4.98 per hour. Average oxygen saturation was 98%, nadir was 92%. Brad Walker PLM index was 2.5 per hour. Brad Walker had a next a MSLT on 11/22/2012 with a mean sleep latency of 6.3 minutes for 5 naps and 5 REM onset naps. Brad Walker sleep studies were interpreted by Dr. Riki Sheer.  I started him on [name] at Brad time which Brad Walker started about a month ago. Of note, in Brad interim, Brad Walker presented to Brad emergency room on 01/19/2017 after a seizure with about 15 minutes of postictal state reported. Brad Walker seizure was partially witnessed by Brad Walker father. Walker reported last having used cocaine on 01/13/2017. A UDS was not done in Brad emergency room. I reviewed Brad ER records. Brad Walker was started on Keppra 500 mg twice a day recently by Dr.  Jannifer Franklin.  Brad Walker had a head CT without contrast on 01/19/2017 which I reviewed: IMPRESSION: No CT evidence for acute intracranial abnormality. Paranasal sinus disease.   We called Walker on 01/30/2017 advising him that it may not be safe for him to take [name] in Brad context of ongoing cocaine abuse. Brad Walker reported Brad Walker did not use cocaine recently and did not recall telling Brad emergency room provider that Brad Walker had recently used cocaine.  Today, 01/31/2017 (all dictated new, as well as above notes, some dictation done in note pad or Word, outside of chart, may appear as copied):   Brad Walker reports doing better with [name], more alert. Walker also goes on to report that Brad Walker has had 6 total seizures in Brad interim, Brad Walker did not call our office for seizure events. Brad Walker last seizure reportedly was on 01/19/2017. Brad Walker reports that Brad Walker was at a party Brad night before. Brad Walker claims to have had one beer. Brad Walker did not take Brad Walker iron for that reason. Mom has been staying with him since April. She is retired. She came from [facility]. Brad Walker mother was not at Brad party she admits. Brad Walker reports that Brad Walker has not used cocaine on 01/13/2017 contrary to what was recorded in Brad emergency room history. Walker currently takes [name] 3.75 g for Brad first dose and 3 g for Brad second dose. Brad Walker feels that it has helped. Brad Walker reports being compliant with Keppra as well. Brad Walker did not  call our office for interim seizure events. Per mom, Brad Walker had  seizures on 3/24, 4/22, 5/13, 5/18, usually falls to Brad ground and has generalized convulsive episode, she was there for 2 events. Father was there for Brad last event. Brad Walker has postictal confusion and loss of memory and no recollection of Brad actual events.  Brad Walker's allergies, current medications, family history, past medical history, past social history, past surgical history and problem list were reviewed and updated as appropriate.   Previously (copied from previous notes for reference):   I first met  him on 11/07/2016 at Brad request of Dr. Anne Hahn, at which time Brad Walker reported a prior diagnosis of narcolepsy with cataplexy some 4-5 years prior. Sleep study results from Brad Walker previous sleep specialist were not available at Brad time but we receive them in Brad interim. Brad Walker presents for discussion of treatment options and review of Brad Walker previous sleep study results. At Brad time of her first visit Brad Walker was advised to refrain from any illicit drug use.   11/07/2016: Brad Walker was diagnosed with narcolepsy with cataplexy some 4 or 5 years ago. Symptoms started when Brad Walker was a sophomore in college. Brad Walker started having what Brad Walker calls drop attacks which would be sudden smaller muscle jerks or weakness episodes or actual physical collapse. Brad Walker was often sleep deprived. Brad Walker did not have any overwhelming sense of sleepiness but did have extended sleep at times and difficulty waking up in Brad mornings with sleeping through alarms, even fire alarm. Brad Walker does not have a history of being a sleepy child. Brad Walker has an older brother and had a twin brother, who passed away at age 43 secondary to car accident. Brad Walker does have a family history of seizures, Brad Walker father had a grand mal seizure and Brad Walker twin brother had at least one grand mal seizure from what I understand. Brad Walker has had episodes of loss of consciousness and lack of recollection after being out, this could last a few minutes to 10 minutes at a time, Brad Walker has had involuntary prolonged sleep episodes. From Brad Walker drop attacks Brad Walker has had injuries including hitting Brad Walker head and splitting Brad Walker right eyebrow. Brad Walker had sleep study and nap study testing some 4 or 5 years ago under Dr. Myles Lipps out of [facility], we will try to get records from Brad Walker sleep study and nap study testing. Brad Walker is currently not on any medication but briefly tried Nuvigil and Effexor, stopped those after a few weeks or a month as Brad Walker did not find them effective. Brad Walker does have a history of drinking alcohol  sometimes excessively, not daily but often more heavier on weekends.  Prior sleep study results are not available for my review today. I reviewed your office note from 10/23/2016. Brad Walker presented with a history of witnessed seizure event. Brad Walker had an EEG in Brad interim on 10/31/2016 which showed normal findings for Brad awake and sleep states. Brad Walker had a head CT without contrast on 08/18/2017 with normal findings, Brad Walker is scheduled for a brain MRI tomorrow. Brad Walker has been told not to drive for 6 months. 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 Brad Walker quit using cocaine and marijuana in January 2018. Brad Walker snores very little or mildly per GF. Brad Walker father has OSA, no family history of narcolepsy. Brad Walker goes to bed around 11 but it takes him sometimes hours to fall asleep. Brad Walker normal  wake time is 7 AM and Brad Walker is sluggish to wake up but does not recall any dreams at night. Brad Walker does report dreaming during naps. Brad Walker can take extended if time allows. Brad Walker has a family history of tremor in Brad Walker older brother and father. Brad Walker has a mild hand tremor himself. Brad Walker is currently not on any medications. Brad Walker Epworth sleepiness score is 13 out of 24, fatigue score is 54 out of 63. Brad Walker denies any sleep paralysis or hypnagogic or hypnopompic hallucinations.    Brad Walker Past Medical History Is Significant For: Past Medical History:  Diagnosis Date  . Narcolepsy   . Seizures (HCC) 10/23/2016    Brad Walker Past Surgical History Is Significant For: Past Surgical History:  Procedure Laterality Date  . SHOULDER SURGERY Right     Brad Walker Family History Is Significant For: Family History  Problem Relation Age of Onset  . Breast cancer Mother   . Diabetes Father   . Hypertension Father   . Depression Father   . Seizures Father   . Seizures Brother     Brad Walker Social History Is Significant For: Social History   Social History  . Marital status: Single    Spouse name: N/A  .  Number of children: 0  . Years of education: college   Occupational History  . [employer]    Social History Main Topics  . Smoking status: Never Smoker  . Smokeless tobacco: Never Used  . Alcohol use Yes     Comment: No drinks since seizure (10/23/16)  . Drug use: No     Comment: Quit Dec 2017- Marijuana  . Sexual activity: Not Asked   Other Topics Concern  . None   Social History Narrative   Lives with two roommates   Caffeine use: Soda daily    Brad Walker Allergies Are:  No Known Allergies:   Brad Walker Current Medications Are:  Outpatient Encounter Prescriptions as of 01/31/2017  Medication Sig  . levETIRAcetam (KEPPRA) 500 MG tablet Take 1 tablet (500 mg total) by mouth 2 (two) times daily.   Facility-Administered Encounter Medications as of 01/31/2017  Medication  . gadopentetate dimeglumine (MAGNEVIST) injection 20 mL  :  Review of Systems:  Out of a complete 14 point review of systems, all are reviewed and negative with Brad exception of these symptoms as listed below: Review of Systems  Neurological:       Walker states that Brad Walker has been taking [name] for about a month. Brad Walker reports that since being on [name] Brad Walker has been showing up to work on time, feels more alert during Brad day, and denies having any cataplexy events.  Brad Walker was able to work up to full prescribed dose but states that Brad Walker had trouble waking up Brad next day. Brad Walker cut back Brad Walker second dose.      Objective:  Neurologic Exam  Physical Exam Physical Examination:   Vitals:   01/31/17 1429  BP: (!) 142/76  Pulse: 80  Resp: 16   General Examination: Brad Walker is a very pleasant 26 y.o. male in no acute distress. Brad Walker appears well-developed and well-nourished and well groomed. Quite anxious appearing.   HEENT: Normocephalic, atraumatic. Hearing is grossly intact. Face is symmetric with normal facial animation. Speech is clear, slightly pressured.   Chest: Clear to auscultation without wheezing, rhonchi or crackles  noted.  Heart: S1+S2+0, regular and normal without murmurs, rubs or gallops noted.   Abdomen: Soft, non-tender.  Extremities: There is no obvious change.   Skin: Warm  and dry without trophic.  Musculoskeletal: exam reveals no obvious joint deformities, tenderness or joint swelling or erythema.   Neurologically: Mental status: Brad Walker is awake, alert and oriented in all 4 spheres. Brad Walker immediate and remote memory, attention, language skills and fund of knowledge are appropriate. Reports being frustrated, appears anxious.   Cranial nerves II - XII are as described above under HEENT exam.  Motor exam: Normal bulk, strength in noted. Fine motor skills are grossly intact.  Cerebellar testing: There is no truncal or gait ataxia.  Sensory exam: intact to light touch.  Gait, station and balance: Brad Walker stands easily. Gait shows normal stride length and normal pace.               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 Brad Walker started [name] about a month ago. Unfortunately, Brad Walker has had recurrent seizures. I was not aware of Brad Walker multiple seizures. Brad Walker was in Brad emergency room on 01/19/2017 after a witnessed seizure event. Brad Walker has been started on Keppra 500 mg twice a day. Brad Walker reports improvement of Brad Walker sleepiness, daytime alertness and cataplexy after starting [name]. However, at this juncture, I do not think it is fully safe for him to keep taking [name] without having a better understanding of Brad Walker underlying seizure disorder. Brad Walker is therefore advised to taper off of [name] by reducing it to 3 g twice nightly for a week, then 2.25 grams twice nightly for a week then stop. Brad Walker is advised that I would like to get a UDS done today as well as Keppra level. We will call him with Brad Walker test results. I  would suggest a one-month checkup with one of our nurse medications, we will monitor Brad Walker seizures. Brad Walker also is encouraged to abstain completely from alcohol and drugs. I will let Dr. Jannifer Franklin know as well. Brad Walker was started on Keppra 500 mg twice daily on 01/19/2017. Per Walker and Brad Walker mother, Brad Walker has had a total of 6 seizures and I was not aware of this. We will call Brad central pharmacy dispensing [name] to put a hold on further shipments on Brad [name] Walker was given written instructions as to how to taper off. First and foremost, we need to keep him safe. Brad Walker is advised that there is a possibility that in Brad future Brad Walker can restart taking [name] as Brad Walker had good results with it but at this juncture, it is more critical that Brad Walker seizure disorder be under better control and we need to continue to make sure that Brad Walker is not relapsing with Brad Walker cocaine abuse and alcohol intake. I explained my findings to Brad Walker and Brad Walker mother today. Brad Walker was eventually in agreement. I do favor that Brad Walker mother stay with him currently. Brad Walker is in regular counseling and we may involve a psychiatrist if needed. As of right now I do not feel Brad Walker is safe to take [name]. We talked about Brad Walker EEG test results and MRI brain results previously. I spent 15 minutes in total face-to-face time with Brad Walker, more than 50% of which was spent in counseling and coordination of care, reviewing test results, reviewing medication and discussing or reviewing Brad diagnosis of sz d/o narcolepy, substance abuse, its prognosis and treatment options. Pertinent laboratory and imaging test results that were available during this visit with  Brad Walker were reviewed by me and considered in my medical decision making (see chart for details).

## 2017-02-01 ENCOUNTER — Ambulatory Visit: Payer: BC Managed Care – PPO | Admitting: Neurology

## 2017-02-01 ENCOUNTER — Telehealth: Payer: Self-pay

## 2017-02-01 ENCOUNTER — Telehealth: Payer: Self-pay | Admitting: Neurology

## 2017-02-01 DIAGNOSIS — G47411 Narcolepsy with cataplexy: Secondary | ICD-10-CM

## 2017-02-01 DIAGNOSIS — F1911 Other psychoactive substance abuse, in remission: Secondary | ICD-10-CM

## 2017-02-01 DIAGNOSIS — G40909 Epilepsy, unspecified, not intractable, without status epilepticus: Secondary | ICD-10-CM

## 2017-02-01 LAB — DRUG SCREEN 12+ALCOHOL+CRT, UR
Amphetamines, Urine: NEGATIVE ng/mL
BENZODIAZ UR QL: NEGATIVE ng/mL
Barbiturate: NEGATIVE ng/mL
CREATININE, RANDOM U: 13.5 mg/dL — AB (ref 20.0–300.0)
Cannabinoids: NEGATIVE ng/mL
Cocaine (Metabolite): NEGATIVE ng/mL
Ethanol U, Quan: NEGATIVE %
MEPERIDINE: NEGATIVE ng/mL
Methadone: NEGATIVE ng/mL
OPIATE SCREEN URINE: NEGATIVE ng/mL
OXYCODONE+OXYMORPHONE UR QL SCN: NEGATIVE ng/mL
PHENCYCLIDINE: NEGATIVE ng/mL
PROPOXYPHENE: NEGATIVE ng/mL
SPECIFIC GRAVITY: 1.0016
Tramadol: NEGATIVE ng/mL

## 2017-02-01 NOTE — Progress Notes (Signed)
Please advise patient that urine sample was not reliable, urine was too diluted. We checked with the lab director and sample was too diluted as indicated by low urine specific gravity and low urine creatine.  I will order repeat test, please advise him to come in tomorrow between 8 and 11 AM

## 2017-02-01 NOTE — Telephone Encounter (Signed)
I spoke to pharmacist at [name]. I have passed along message that Dr. Frances FurbishAthar would like to hold any further shipments of [name] at this time. Pharmacist stated that she put his account on a "MD hold". We can call back if we would like to restart [name].

## 2017-02-01 NOTE — Telephone Encounter (Signed)
-----   Message from Diana S Turner, RN sent at 02/01/2017  4:46 PM EDT -----   ----- Message ----- From: Athar, Saima, MD Sent: 02/01/2017   4:09 PM To: Diana S Turner, RN  Please advise patient that urine sample was not reliable, urine was too diluted. We checked with the lab director and sample was too diluted as indicated by low urine specific gravity and low urine creatine.  I will order repeat test, please advise him to come in tomorrow between 8 and 11 AM 

## 2017-02-01 NOTE — Addendum Note (Signed)
Addended by: Huston FoleyATHAR, Dajanay Northrup on: 02/01/2017 04:09 PM   Modules accepted: Orders

## 2017-02-01 NOTE — Telephone Encounter (Signed)
I called pt, LVM, see other telephone note.

## 2017-02-01 NOTE — Telephone Encounter (Signed)
I called pt to discuss his UDS results. No answer, left a message asking him to call me back.

## 2017-02-01 NOTE — Telephone Encounter (Signed)
Repeat UDS ordered for tomorrow, see result note for details.

## 2017-02-02 LAB — LEVETIRACETAM LEVEL: LEVETIRACETAM: 4.9 ug/mL — AB (ref 10.0–40.0)

## 2017-02-05 NOTE — Telephone Encounter (Signed)
-----   Message from Crisoforo Oxfordiana S Turner, RN sent at 02/01/2017  4:46 PM EDT -----   ----- Message ----- From: Huston FoleyAthar, Saima, MD Sent: 02/01/2017   4:09 PM To: Crisoforo Oxfordiana S Turner, RN  Please advise patient that urine sample was not reliable, urine was too diluted. We checked with the lab director and sample was too diluted as indicated by low urine specific gravity and low urine creatine.  I will order repeat test, please advise him to come in tomorrow between 8 and 11 AM

## 2017-02-05 NOTE — Telephone Encounter (Signed)
I called the mother. I discussed the issues with the UDS being too dilute, will need to be repeated.   He cannot have xyrem due to drug and alcohol abuse.  ? Could use provigil?  Will follow up the patient for seizures.

## 2017-02-05 NOTE — Telephone Encounter (Signed)
Pt's mother said the has called her but she doesn't understand why and what is needed. Please call her at [phone]

## 2017-02-05 NOTE — Telephone Encounter (Signed)
Pt mother called back just to make sure she will get a cal back.  Informed her this has been sent to Dr Anne HahnWillis for review (based on last message) she will wait for a call

## 2017-02-05 NOTE — Telephone Encounter (Signed)
Will need to repeat UDS ASAP.  Brad Walker, patient has had 6 total seizures, he reported to me last week. Keppra level 4.9 only. Not sure if he is taking med or dose too low, what would your suggest. UDS was not reliable as sample was too diluted.

## 2017-02-05 NOTE — Telephone Encounter (Signed)
I have spoken with pt. this am, and per SA, explained that urine was too diluted to be tested.  Requested he come in this am for a drug screen.  Although he is not at work now, he sts. he will be going to work and not able to come in for uds.  Sts. he will check with his supervisor and call back today with a date/time that he can come in.  Will advise SA of pt's response/fim

## 2017-02-05 NOTE — Telephone Encounter (Signed)
Pt returned the call. He said orientation with his job is going on this week and wanted to know if the labs could wait until next Monday.

## 2017-02-05 NOTE — Telephone Encounter (Signed)
I have spoken with Brad Walker again today and advised that UDS really can't wait--he should come in over the next 24 hours.  Our office had been trying to reach him since 02/01/17.  He verbalized understanding of same, sts. he tried to call back on 02/01/17, but it was after 5pm so there was no answer, sts. our business answering message sts. we are only open from Monday-Thursday, so he didn't try to call back Friday. Sts. he works at [employer], is participating in [employer] right now; not able to come in for UDS until next week.  Lab hrs. given and I have advised he may come in prior to work, during a lunch break, after work, no appt. needed, just try to avoid 1145-1230 so he doesn't have to wait.  He continues to insist there is no time at all that he can come in for repeat UDS prior to Monday. I advised he f/u with pcp due to the abnormal dilution of his urine, possible electrolyte imbalance that can affect his heart, and he sts. he isn't sure why urine was diluted, that "I pretty much consistently drink the same amt. of water."  Pt. wanted to continue discussing why he is not able to come in for repeat UDS.  I have explained that there is a clear reason why testing is done at certain times, and why repeat tests that were questionable can't be delayed; information which I am sure he is aware of; please let us know if he is going to be able to complete UDS ordered by SA/fim

## 2017-02-05 NOTE — Telephone Encounter (Signed)
Please call patient back: Urine lab testing cannot wait till Monday, needs to be repeated ASAP. In addition, please make sure patient knows to follow-up with primary care physician: If he has been drinking a lot of water recently which appeared to be the case with the last urine testing, he may have electrolyte disturbance that needs to be checked ASAP. He can come in between 8 AM and 4:30 PM, except between 11:45 and 12:30, due to our phlebotomist's lunch break.

## 2017-02-05 NOTE — Telephone Encounter (Signed)
Not sure how to help this gentleman, will route to Dr. Anne HahnWillis and Kara MeadEmma, as he may need adjustment of Seizure meds and FU for Sz d/o.

## 2017-02-06 ENCOUNTER — Other Ambulatory Visit (INDEPENDENT_AMBULATORY_CARE_PROVIDER_SITE_OTHER): Payer: Self-pay

## 2017-02-06 DIAGNOSIS — G47411 Narcolepsy with cataplexy: Secondary | ICD-10-CM

## 2017-02-06 DIAGNOSIS — F1911 Other psychoactive substance abuse, in remission: Secondary | ICD-10-CM

## 2017-02-06 DIAGNOSIS — Z0289 Encounter for other administrative examinations: Secondary | ICD-10-CM

## 2017-02-06 DIAGNOSIS — G40909 Epilepsy, unspecified, not intractable, without status epilepticus: Secondary | ICD-10-CM

## 2017-02-07 LAB — DRUG SCREEN 12+ALCOHOL+CRT, UR
Amphetamines, Urine: NEGATIVE ng/mL
BARBITURATE: NEGATIVE ng/mL
BENZODIAZ UR QL: NEGATIVE ng/mL
CANNABINOIDS: NEGATIVE ng/mL
COCAINE (METABOLITE): NEGATIVE ng/mL
Creatinine, Urine: 19 mg/dL — ABNORMAL LOW (ref 20.0–300.0)
ETHANOL U, QUAN: NEGATIVE %
METHADONE: NEGATIVE ng/mL
Meperidine: NEGATIVE ng/mL
OPIATE SCREEN URINE: NEGATIVE ng/mL
OXYCODONE+OXYMORPHONE UR QL SCN: NEGATIVE ng/mL
PROPOXYPHENE: NEGATIVE ng/mL
Phencyclidine: NEGATIVE ng/mL
Specific Gravity: 1.0017
Tramadol: NEGATIVE ng/mL

## 2017-02-07 NOTE — Progress Notes (Signed)
UDS negative. Keep follow-up for seizure management with either Dr. Anne HahnWillis or nurse practitioner as scheduled.please notify patient.  Huston FoleySaima Danyon Mcginness, MD, PhD [facility]

## 2017-02-08 ENCOUNTER — Telehealth: Payer: Self-pay

## 2017-02-08 NOTE — Telephone Encounter (Signed)
I spoke to Dr. Frances FurbishAthar and she would prefer that Dr. Anne HahnWillis address the low levetiracetam level. I called pt and advised him of this. Pt asked that I send a message to Dr. Anne HahnWillis to get his thoughts on the low levetiracetam level. Pt verbalized understanding.

## 2017-02-08 NOTE — Telephone Encounter (Signed)
I called pt. I advised him that his UDS was negative. I encouraged him to keep following up with Dr. Anne HahnWillis and his NP regarding his seizures. Pt reports that Dr. Frances FurbishAthar ordered a levetiracetam level and he can see the results in mychart and can see that his level is low, and wants Dr. Teofilo PodAthar's advice and recommendations on what to do regarding this level.

## 2017-02-08 NOTE — Telephone Encounter (Signed)
-----   Message from Huston FoleySaima Athar, MD sent at 02/07/2017  6:12 PM EDT ----- UDS negative. Keep follow-up for seizure management with either Dr. Anne HahnWillis or nurse practitioner as scheduled.please notify patient.  Huston FoleySaima Athar, MD, PhD [facility]

## 2017-02-09 NOTE — Telephone Encounter (Signed)
I called patient. I usually do not follow Keppra levels, I usually follow clinical response to the medication.  The patient to stay on 500 mg twice daily of the Keppra, we will adjust the dose if needed. Of course, staying off cocaine will markedly improve his chances of not having a seizure.

## 2017-02-27 ENCOUNTER — Ambulatory Visit: Payer: BC Managed Care – PPO | Admitting: Nurse Practitioner

## 2017-02-28 ENCOUNTER — Encounter: Payer: Self-pay | Admitting: Nurse Practitioner

## 2017-03-05 ENCOUNTER — Ambulatory Visit: Payer: BC Managed Care – PPO | Admitting: Nurse Practitioner

## 2017-03-05 ENCOUNTER — Telehealth: Payer: Self-pay | Admitting: Neurology

## 2017-03-05 NOTE — Telephone Encounter (Signed)
I called xyrem [facility], I advised Aggie Cosierheresa that this pt's xyrem is on hold until further notice. She did not need anything further from me at this time.

## 2017-03-05 NOTE — Telephone Encounter (Signed)
Marcelino [facility]Michelle from [facility] is wanting to speak with you re: Pt's [name] prescription please call [phone] option 3 and option 3 again

## 2017-03-08 ENCOUNTER — Telehealth: Payer: Self-pay | Admitting: Neurology

## 2017-03-08 NOTE — Telephone Encounter (Signed)
Error

## 2017-03-12 ENCOUNTER — Telehealth: Payer: Self-pay | Admitting: *Deleted

## 2017-03-12 NOTE — Telephone Encounter (Signed)
LMTC.  He dropped off FMLA paperwork.  Need more details--what specifically he needs.Brad Walker/fim

## 2017-03-13 ENCOUNTER — Telehealth: Payer: Self-pay | Admitting: Neurology

## 2017-03-13 ENCOUNTER — Telehealth: Payer: Self-pay | Admitting: *Deleted

## 2017-03-13 ENCOUNTER — Encounter: Payer: Self-pay | Admitting: Neurology

## 2017-03-13 ENCOUNTER — Ambulatory Visit (INDEPENDENT_AMBULATORY_CARE_PROVIDER_SITE_OTHER): Payer: BC Managed Care – PPO | Admitting: Neurology

## 2017-03-13 VITALS — BP 142/94 | HR 73 | Ht 70.0 in | Wt 201.0 lb

## 2017-03-13 DIAGNOSIS — G47411 Narcolepsy with cataplexy: Secondary | ICD-10-CM

## 2017-03-13 DIAGNOSIS — R569 Unspecified convulsions: Secondary | ICD-10-CM

## 2017-03-13 DIAGNOSIS — F1410 Cocaine abuse, uncomplicated: Secondary | ICD-10-CM | POA: Diagnosis not present

## 2017-03-13 DIAGNOSIS — G47419 Narcolepsy without cataplexy: Secondary | ICD-10-CM | POA: Insufficient documentation

## 2017-03-13 HISTORY — DX: Cocaine abuse, uncomplicated: F14.10

## 2017-03-13 MED ORDER — MODAFINIL 200 MG PO TABS: ORAL_TABLET | ORAL | 5 refills | Status: DC

## 2017-03-13 NOTE — Telephone Encounter (Signed)
Pt gave fax number for [employer] [facility] [phone]. Pts best call back [phone]

## 2017-03-13 NOTE — Telephone Encounter (Signed)
Gave completed family medical leave form for [employer] to medical records to process.   Faxed office note with signed release form by patient to [facility] as requested per pt. Received confirmation. Address: [address] [place], [place] [id number] Phone: [phone] Fax: [phone]

## 2017-03-13 NOTE — Telephone Encounter (Signed)
Noted, thank you

## 2017-03-13 NOTE — Progress Notes (Signed)
Faxed printed/signed rx modafinil to pt pharmacy. Fax: [phone]. Received confirmation.

## 2017-03-13 NOTE — Progress Notes (Signed)
Reason for visit: Seizures  Brad Walker is an 26 y.o. male  History of present illness:  Brad Walker is a 26 year old right-handed white male with a history of cocaine abuse and a history of seizures. The patient also has narcolepsy associated with cataplexy. The patient has been seen through sleep medicine, they did not prescribe [name] because of the history of active cocaine abuse. The patient is on Keppra for seizures, he is tolerating the drug quite well, he has last had a seizure on 01/19/2017, this was when the Keppra was started. The patient is not operating a motor vehicle. He is reporting problems with insomnia at times, when he does get sleepy he may sleep 12-16 hours at a time. He does have occasional episodes of cataplexy. He is contemplating getting into a drug rehabilitation program through the [facility]. He needs an FMLA form filled out for absence from work to get drug rehabilitation. He returns for an evaluation.   Past Medical History:  Diagnosis Date  . Cocaine abuse 03/13/2017  . Narcolepsy   . Seizures (HCC) 10/23/2016    Past Surgical History:  Procedure Laterality Date  . SHOULDER SURGERY Right     Family History  Problem Relation Age of Onset  . Breast cancer Mother   . Diabetes Father   . Hypertension Father   . Depression Father   . Seizures Father   . Seizures Brother     Social history:  reports that he has never smoked. He has never used smokeless tobacco. He reports that he drinks alcohol. He reports that he does not use drugs.   No Known Allergies  Medications:  Prior to Admission medications   Medication Sig Start Date End Date Taking? Authorizing Provider  levETIRAcetam (KEPPRA) 500 MG tablet Take 1 tablet (500 mg total) by mouth 2 (two) times daily. 01/19/17  Yes York Spaniel, MD    ROS:  Out of a complete 14 system review of symptoms, the patient complains only of the following symptoms, and all other reviewed systems  are negative.  Seizures  Blood pressure (!) 142/94, pulse 73, height 5\' 10"  (1.778 m), weight 201 lb (91.2 kg).  Physical Exam  General: The patient is alert and cooperative at the time of the examination.  Skin: No significant peripheral edema is noted.   Neurologic Exam  Mental status: The patient is alert and oriented x 3 at the time of the examination. The patient has apparent normal recent and remote memory, with an apparently normal attention span and concentration ability.   Cranial nerves: Facial symmetry is present. Speech is normal, no aphasia or dysarthria is noted. Extraocular movements are full. Visual fields are full.  Motor: The patient has good strength in all 4 extremities.  Sensory examination: Soft touch sensation is symmetric on the face, arms, and legs.  Coordination: The patient has good finger-nose-finger and heel-to-shin bilaterally.  Gait and station: The patient has a normal gait. Tandem gait is normal. Romberg is negative. No drift is seen.  Reflexes: Deep tendon reflexes are symmetric.   Assessment/Plan:  1. Seizure disorder  2. Cocaine abuse  3. Narcolepsy  The patient will be given a prescription for Provigil taking 200 mg in the morning and 200 mg in early afternoon. The patient will remain on Keppra at the current dose, he will follow-up in 4 months. He is not to operate a motor vehicle until further notice. An FMLA form will be filled out for  him.  Marlan Palau. Keith Darrel Gloss MD 03/13/2017 12:33 PM  [facility] [address] [place], [place] [id number]  Phone [phone] Fax [phone]

## 2017-03-13 NOTE — Telephone Encounter (Signed)
Submitted PA modafinil on covermymeds.  Key: [id number] - PA Case ID: [id number] - Rx #: [id number]: [id number] Per covermymeds: "Your information has been submitted to [employer]. To check for an updated outcome later, reopen this PA request from your dashboard.  If [employer] has not responded to your request within 24 hours, contact [employer] at [phone]. If you think there may be a problem with your PA request, use our live chat feature at the bottom right."

## 2017-03-13 NOTE — Telephone Encounter (Signed)
PA modafinil approved effective 03/13/2017 - 03/13/2018

## 2017-03-14 ENCOUNTER — Encounter: Payer: Self-pay | Admitting: Neurology

## 2017-03-15 ENCOUNTER — Encounter: Payer: Self-pay | Admitting: Neurology

## 2017-03-15 NOTE — Telephone Encounter (Signed)
See other mychart message. This is addressed.

## 2017-03-21 ENCOUNTER — Encounter: Payer: Self-pay | Admitting: Neurology

## 2017-04-27 ENCOUNTER — Encounter (HOSPITAL_COMMUNITY): Payer: Self-pay | Admitting: Psychology

## 2017-04-27 ENCOUNTER — Ambulatory Visit (INDEPENDENT_AMBULATORY_CARE_PROVIDER_SITE_OTHER): Payer: BC Managed Care – PPO | Admitting: Psychology

## 2017-04-27 DIAGNOSIS — F1020 Alcohol dependence, uncomplicated: Secondary | ICD-10-CM

## 2017-04-27 DIAGNOSIS — F1420 Cocaine dependence, uncomplicated: Secondary | ICD-10-CM | POA: Diagnosis not present

## 2017-04-30 ENCOUNTER — Other Ambulatory Visit (HOSPITAL_COMMUNITY): Payer: BC Managed Care – PPO | Attending: Psychiatry | Admitting: Psychology

## 2017-04-30 DIAGNOSIS — Z79899 Other long term (current) drug therapy: Secondary | ICD-10-CM | POA: Insufficient documentation

## 2017-04-30 DIAGNOSIS — G47411 Narcolepsy with cataplexy: Secondary | ICD-10-CM | POA: Diagnosis not present

## 2017-04-30 DIAGNOSIS — F4312 Post-traumatic stress disorder, chronic: Secondary | ICD-10-CM | POA: Insufficient documentation

## 2017-04-30 DIAGNOSIS — F1020 Alcohol dependence, uncomplicated: Secondary | ICD-10-CM | POA: Insufficient documentation

## 2017-04-30 DIAGNOSIS — Z62819 Personal history of unspecified abuse in childhood: Secondary | ICD-10-CM | POA: Diagnosis not present

## 2017-04-30 DIAGNOSIS — G40909 Epilepsy, unspecified, not intractable, without status epilepticus: Secondary | ICD-10-CM | POA: Insufficient documentation

## 2017-04-30 DIAGNOSIS — F411 Generalized anxiety disorder: Secondary | ICD-10-CM | POA: Insufficient documentation

## 2017-04-30 DIAGNOSIS — F341 Dysthymic disorder: Secondary | ICD-10-CM | POA: Insufficient documentation

## 2017-04-30 DIAGNOSIS — F1420 Cocaine dependence, uncomplicated: Secondary | ICD-10-CM | POA: Insufficient documentation
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
## 2017-05-01 ENCOUNTER — Encounter (HOSPITAL_COMMUNITY): Payer: Self-pay | Admitting: Psychology

## 2017-05-01 NOTE — Progress Notes (Signed)
    Daily Group Progress Note  Program: CD-IOP   05/01/2017 Brad Walker [id number]  Diagnosis:  No diagnosis found.   Sobriety Date: 04/25/17  Group Time: 1-2:30pm  Participation Level: Active  Behavioral Response: Appropriate and Sharing  Type of Therapy: Process Group  Interventions: Supportive  Topic: Process: The first half of group was spent in process. Members shared about their challenges and successes over the past weekend. Two new members were present in group today and they introduced themselves and shared about the circumstances that had brought them here. One group member disclosed that he had used over the weekend. His sobriety date was today. He had been using last week and had been taken out of group last Wednesday. His return to use was discussed at length. The medical director met with one of the new group members and a member who will be discharging later this week. Five drug tests were collected.   Group Time: 2:30-4pm  Participation Level: Active  Behavioral Response: Appropriate and Sharing  Type of Therapy: Psycho-education Group  Interventions: Family Systems  Topic: Psycho-Ed: Family Roles in Addicted/Dysfunctional Families. The second half of group was spent in a psycho-ed. A handout identifying the different family roles was provided and a discussion followed. Members took turns reading the description of each of the five most commonly identified roles that family members take on. Members identified themselves and explained how and why they may have undertaken these roles and what they had hoped to achieve. There was good disclosure among group members and the two new group members shared at length about their roles their family of origin.   Summary: The patient was new to group today. When asked to introduce himself he shared about his alcohol and drug use and the growing impact it has had on his daily life. He reported he had recently returned from  residential treatment. As the patient shared in the process session of group, he displayed good insight into recovery concepts. At one point, he noted that "I am addicted to more", demonstrating his understanding of the deeper indications of the addictive self. The patient disclosed the devastation that his twin brother's sudden death three years ago has had on him. He also talked about his narcolepsy and the seizures he has suffered in the past six months. In the psycho-ed, the patient shared about how he had used his grief to manipulate others and justify/feed his addiction. He also admitted that "I am my harshest critic". The patient displayed a good sense of humor, but was also serious and emotional when appropriate. He provided helpful feedback and responded favorably to his first group session.   UDS collected: Yes Results: pending  AA/NA attended?: YesThursday, Friday, Saturday and Sunday  Sponsor?: No   Brandon Melnick, LCAS 05/01/2017 12:40 PM

## 2017-05-02 ENCOUNTER — Encounter (HOSPITAL_COMMUNITY): Payer: Self-pay | Admitting: Psychology

## 2017-05-02 ENCOUNTER — Other Ambulatory Visit (INDEPENDENT_AMBULATORY_CARE_PROVIDER_SITE_OTHER): Payer: BC Managed Care – PPO | Admitting: Medical

## 2017-05-02 VITALS — BP 136/80 | HR 80 | Resp 16 | Ht 70.0 in | Wt 200.0 lb

## 2017-05-02 DIAGNOSIS — R569 Unspecified convulsions: Secondary | ICD-10-CM

## 2017-05-02 DIAGNOSIS — F1420 Cocaine dependence, uncomplicated: Secondary | ICD-10-CM | POA: Diagnosis not present

## 2017-05-02 DIAGNOSIS — F411 Generalized anxiety disorder: Secondary | ICD-10-CM

## 2017-05-02 DIAGNOSIS — Z62819 Personal history of unspecified abuse in childhood: Secondary | ICD-10-CM

## 2017-05-02 DIAGNOSIS — F4312 Post-traumatic stress disorder, chronic: Secondary | ICD-10-CM

## 2017-05-02 DIAGNOSIS — S43431D Superior glenoid labrum lesion of right shoulder, subsequent encounter: Secondary | ICD-10-CM

## 2017-05-02 DIAGNOSIS — F341 Dysthymic disorder: Secondary | ICD-10-CM

## 2017-05-02 DIAGNOSIS — S43431A Superior glenoid labrum lesion of right shoulder, initial encounter: Secondary | ICD-10-CM | POA: Insufficient documentation

## 2017-05-02 DIAGNOSIS — F1020 Alcohol dependence, uncomplicated: Secondary | ICD-10-CM | POA: Insufficient documentation

## 2017-05-02 DIAGNOSIS — F329 Major depressive disorder, single episode, unspecified: Secondary | ICD-10-CM

## 2017-05-02 DIAGNOSIS — G47411 Narcolepsy with cataplexy: Secondary | ICD-10-CM
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
## 2017-05-03 ENCOUNTER — Encounter (HOSPITAL_COMMUNITY): Payer: Self-pay | Admitting: Medical

## 2017-05-03 ENCOUNTER — Other Ambulatory Visit (HOSPITAL_COMMUNITY): Payer: BC Managed Care – PPO

## 2017-05-03 ENCOUNTER — Telehealth (HOSPITAL_COMMUNITY): Payer: Self-pay | Admitting: Psychology

## 2017-05-04 ENCOUNTER — Telehealth (HOSPITAL_COMMUNITY): Payer: Self-pay | Admitting: Licensed Clinical Social Worker

## 2017-05-04 NOTE — Telephone Encounter (Signed)
Phoned to inquired about pt's status since he missed group yesterday (about which he previously informed CDIOP). Asked pt to return call before 2pm today.

## 2017-05-08 ENCOUNTER — Telehealth (HOSPITAL_COMMUNITY): Payer: Self-pay | Admitting: Licensed Clinical Social Worker

## 2017-05-08 NOTE — Telephone Encounter (Signed)
Phoned pt w/ no response. Left message concerning his MIA status in CDIOP.

## 2017-05-08 NOTE — Telephone Encounter (Signed)
Pt mother called and stated she did receive a text message from pt today in response to her inquiry. Pt expressed disinterest in "anything" and was unable to reach via voice. Counselor advised mother to call this Clinical research associatewriter if anything changes or pt sends further concerning messages.

## 2017-05-08 NOTE — Telephone Encounter (Signed)
Phoned to inquire about pt's status and known whereabouts. Mother states she has texted w/ pt briefly over the weekend but became concerned w/ he did not answer her text messages yesterday. Pt advised mother to reach out to pt asap and return call to this writer to verify pt's safety and whereabouts.

## 2017-05-08 NOTE — Telephone Encounter (Signed)
Emailed pt asking him to return email and discuss his MIA status in CDIOP.

## 2017-05-09 ENCOUNTER — Encounter (HOSPITAL_COMMUNITY): Payer: Self-pay | Admitting: Licensed Clinical Social Worker

## 2017-05-09 ENCOUNTER — Other Ambulatory Visit (INDEPENDENT_AMBULATORY_CARE_PROVIDER_SITE_OTHER): Payer: BC Managed Care – PPO | Admitting: Medical

## 2017-05-09 DIAGNOSIS — F4312 Post-traumatic stress disorder, chronic: Secondary | ICD-10-CM

## 2017-05-09 DIAGNOSIS — F1020 Alcohol dependence, uncomplicated: Secondary | ICD-10-CM

## 2017-05-09 DIAGNOSIS — F1420 Cocaine dependence, uncomplicated: Secondary | ICD-10-CM

## 2017-05-09 DIAGNOSIS — F411 Generalized anxiety disorder: Secondary | ICD-10-CM

## 2017-05-09 DIAGNOSIS — G47411 Narcolepsy with cataplexy: Secondary | ICD-10-CM

## 2017-05-09 DIAGNOSIS — Z5329 Procedure and treatment not carried out because of patient's decision for other reasons: Secondary | ICD-10-CM

## 2017-05-09 DIAGNOSIS — Z62819 Personal history of unspecified abuse in childhood: Secondary | ICD-10-CM

## 2017-05-09 DIAGNOSIS — R569 Unspecified convulsions: Secondary | ICD-10-CM

## 2017-05-09 DIAGNOSIS — Z9111 Patient's noncompliance with dietary regimen: Secondary | ICD-10-CM

## 2017-05-09 DIAGNOSIS — F341 Dysthymic disorder: Secondary | ICD-10-CM
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
## 2017-05-10 ENCOUNTER — Telehealth (HOSPITAL_COMMUNITY): Payer: Self-pay | Admitting: Licensed Clinical Social Worker

## 2017-05-10 ENCOUNTER — Encounter (HOSPITAL_COMMUNITY): Payer: Self-pay | Admitting: Medical

## 2017-05-10 ENCOUNTER — Other Ambulatory Visit (HOSPITAL_COMMUNITY): Payer: BC Managed Care – PPO

## 2017-05-10 ENCOUNTER — Telehealth (HOSPITAL_COMMUNITY): Payer: Self-pay | Admitting: Psychology

## 2017-05-10 NOTE — Telephone Encounter (Signed)
Mother phoned to tell counselor that [place] his safety. [facility] and will keep counselor updated.

## 2017-05-11 ENCOUNTER — Emergency Department (HOSPITAL_COMMUNITY)
Admission: EM | Admit: 2017-05-11 | Discharge: 2017-05-12 | Disposition: A | Discharge status: Other Acute Inpatient Hospital | Payer: BC Managed Care – PPO | Attending: Emergency Medicine | Admitting: Emergency Medicine

## 2017-05-11 ENCOUNTER — Telehealth (HOSPITAL_COMMUNITY): Payer: Self-pay | Admitting: Licensed Clinical Social Worker

## 2017-05-11 ENCOUNTER — Encounter (HOSPITAL_COMMUNITY): Payer: Self-pay | Admitting: Licensed Clinical Social Worker

## 2017-05-11 DIAGNOSIS — F32A Depression, unspecified: Secondary | ICD-10-CM

## 2017-05-11 DIAGNOSIS — Z79899 Other long term (current) drug therapy: Secondary | ICD-10-CM | POA: Diagnosis not present

## 2017-05-11 DIAGNOSIS — F332 Major depressive disorder, recurrent severe without psychotic features: Secondary | ICD-10-CM | POA: Diagnosis not present

## 2017-05-11 DIAGNOSIS — Z046 Encounter for general psychiatric examination, requested by authority: Secondary | ICD-10-CM | POA: Diagnosis not present

## 2017-05-11 DIAGNOSIS — R45850 Homicidal ideations: Secondary | ICD-10-CM | POA: Insufficient documentation

## 2017-05-11 DIAGNOSIS — Z818 Family history of other mental and behavioral disorders: Secondary | ICD-10-CM | POA: Diagnosis not present

## 2017-05-11 DIAGNOSIS — R45851 Suicidal ideations: Secondary | ICD-10-CM | POA: Diagnosis not present

## 2017-05-11 DIAGNOSIS — F339 Major depressive disorder, recurrent, unspecified: Secondary | ICD-10-CM | POA: Diagnosis present

## 2017-05-11 DIAGNOSIS — F1020 Alcohol dependence, uncomplicated: Secondary | ICD-10-CM | POA: Diagnosis present

## 2017-05-11 DIAGNOSIS — F10920 Alcohol use, unspecified with intoxication, uncomplicated: Secondary | ICD-10-CM | POA: Insufficient documentation

## 2017-05-11 DIAGNOSIS — F329 Major depressive disorder, single episode, unspecified: Secondary | ICD-10-CM | POA: Diagnosis present

## 2017-05-11 DIAGNOSIS — F1910 Other psychoactive substance abuse, uncomplicated: Secondary | ICD-10-CM | POA: Diagnosis not present

## 2017-05-11 DIAGNOSIS — F1420 Cocaine dependence, uncomplicated: Secondary | ICD-10-CM | POA: Diagnosis present

## 2017-05-11 LAB — CBC
HCT: 46.5 % (ref 39.0–52.0)
Hemoglobin: 16.8 g/dL (ref 13.0–17.0)
MCH: 30.4 pg (ref 26.0–34.0)
MCHC: 36.1 g/dL — AB (ref 30.0–36.0)
MCV: 84.2 fL (ref 78.0–100.0)
PLATELETS: 200 10*3/uL (ref 150–400)
RBC: 5.52 MIL/uL (ref 4.22–5.81)
RDW: 12.4 % (ref 11.5–15.5)
WBC: 7.1 10*3/uL (ref 4.0–10.5)

## 2017-05-11 LAB — COMPREHENSIVE METABOLIC PANEL
ALK PHOS: 65 U/L (ref 38–126)
ALT: 50 U/L (ref 17–63)
ANION GAP: 13 (ref 5–15)
AST: 29 U/L (ref 15–41)
Albumin: 4.5 g/dL (ref 3.5–5.0)
BILIRUBIN TOTAL: 0.5 mg/dL (ref 0.3–1.2)
BUN: 8 mg/dL (ref 6–20)
CALCIUM: 9.1 mg/dL (ref 8.9–10.3)
CO2: 22 mmol/L (ref 22–32)
CREATININE: 0.99 mg/dL (ref 0.61–1.24)
Chloride: 107 mmol/L (ref 101–111)
GFR calc Af Amer: >60 mL/min (ref >60)
GFR calc non Af Amer: >60 mL/min (ref >60)
GLUCOSE: 119 mg/dL — AB (ref 65–99)
Potassium: 4.1 mmol/L (ref 3.5–5.1)
Sodium: 142 mmol/L (ref 135–145)
TOTAL PROTEIN: 7.5 g/dL (ref 6.5–8.1)

## 2017-05-11 LAB — RAPID URINE DRUG SCREEN, HOSP PERFORMED
AMPHETAMINES: NOT DETECTED
Barbiturates: NOT DETECTED
Benzodiazepines: NOT DETECTED
Cocaine: POSITIVE — AB
OPIATES: NOT DETECTED
Tetrahydrocannabinol: NOT DETECTED

## 2017-05-11 LAB — ETHANOL: Alcohol, Ethyl (B): 156 mg/dL — ABNORMAL HIGH (ref <5)
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
## 2017-05-11 NOTE — Telephone Encounter (Signed)
Mother of pt called to state pt is "very unstable", has admitted to excessive drinking, owing his dealer money, and wanting to kill himself. Mother asked counselor for advise on how to proceed. Counselor talked w/ clinical supervisor Everlene BallsShawn Taylor and a plan was advised [place] petition [place], and request an involuntary pick up. Mother verbalized understanding and agreed this plan was the best option since she could not guarantee her son's safety.

## 2017-05-11 NOTE — ED Notes (Signed)
etoh & cocaine abuse

## 2017-05-11 NOTE — ED Notes (Signed)
Delay in lab draw due to pt behavior

## 2017-05-11 NOTE — ED Notes (Signed)
EDP at bedside, parents at bedside & [employer] at bedside
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
## 2017-05-12 ENCOUNTER — Inpatient Hospital Stay (HOSPITAL_COMMUNITY)
Admission: AD | Admit: 2017-05-12 | Discharge: 2017-05-15 | DRG: 885 | Disposition: A | Discharge status: Home / Self Care | Payer: BC Managed Care – PPO | Attending: Psychiatry | Admitting: Psychiatry

## 2017-05-12 ENCOUNTER — Encounter (HOSPITAL_COMMUNITY): Payer: Self-pay

## 2017-05-12 DIAGNOSIS — F341 Dysthymic disorder: Secondary | ICD-10-CM | POA: Diagnosis not present

## 2017-05-12 DIAGNOSIS — Z560 Unemployment, unspecified: Secondary | ICD-10-CM | POA: Diagnosis not present

## 2017-05-12 DIAGNOSIS — F4312 Post-traumatic stress disorder, chronic: Secondary | ICD-10-CM | POA: Diagnosis present

## 2017-05-12 DIAGNOSIS — Z818 Family history of other mental and behavioral disorders: Secondary | ICD-10-CM | POA: Diagnosis not present

## 2017-05-12 DIAGNOSIS — F419 Anxiety disorder, unspecified: Secondary | ICD-10-CM | POA: Diagnosis present

## 2017-05-12 DIAGNOSIS — Z91410 Personal history of adult physical and sexual abuse: Secondary | ICD-10-CM | POA: Diagnosis not present

## 2017-05-12 DIAGNOSIS — F1910 Other psychoactive substance abuse, uncomplicated: Secondary | ICD-10-CM | POA: Diagnosis not present

## 2017-05-12 DIAGNOSIS — G47411 Narcolepsy with cataplexy: Secondary | ICD-10-CM | POA: Diagnosis present

## 2017-05-12 DIAGNOSIS — R45851 Suicidal ideations: Secondary | ICD-10-CM

## 2017-05-12 DIAGNOSIS — F339 Major depressive disorder, recurrent, unspecified: Secondary | ICD-10-CM | POA: Diagnosis present

## 2017-05-12 DIAGNOSIS — Z62810 Personal history of physical and sexual abuse in childhood: Secondary | ICD-10-CM | POA: Diagnosis not present

## 2017-05-12 DIAGNOSIS — Z79899 Other long term (current) drug therapy: Secondary | ICD-10-CM

## 2017-05-12 DIAGNOSIS — F332 Major depressive disorder, recurrent severe without psychotic features: Principal | ICD-10-CM | POA: Diagnosis present

## 2017-05-12 DIAGNOSIS — Y906 Blood alcohol level of 120-199 mg/100 ml: Secondary | ICD-10-CM | POA: Diagnosis present

## 2017-05-12 DIAGNOSIS — F1020 Alcohol dependence, uncomplicated: Secondary | ICD-10-CM | POA: Diagnosis not present

## 2017-05-12 DIAGNOSIS — R569 Unspecified convulsions: Secondary | ICD-10-CM | POA: Diagnosis present

## 2017-05-12 DIAGNOSIS — F4310 Post-traumatic stress disorder, unspecified: Secondary | ICD-10-CM | POA: Diagnosis not present

## 2017-05-12 DIAGNOSIS — R450 Nervousness: Secondary | ICD-10-CM | POA: Diagnosis not present

## 2017-05-12 DIAGNOSIS — F1420 Cocaine dependence, uncomplicated: Secondary | ICD-10-CM | POA: Diagnosis not present

## 2017-05-12 DIAGNOSIS — F411 Generalized anxiety disorder: Secondary | ICD-10-CM | POA: Diagnosis not present

## 2017-05-12 DIAGNOSIS — Z630 Problems in relationship with spouse or partner: Secondary | ICD-10-CM | POA: Diagnosis not present

## 2017-05-12 MED ORDER — LEVETIRACETAM 500 MG PO TABS
500.0000 mg | ORAL_TABLET | Freq: Two times a day (BID) | ORAL | Status: DC
  Administered 2017-05-12 – 2017-05-15 (×6): 500 mg via ORAL
  Filled 2017-05-12: qty 1
  Filled 2017-05-12: qty 14
  Filled 2017-05-12 (×8): qty 1
  Filled 2017-05-12: qty 14

## 2017-05-12 MED ORDER — GABAPENTIN 100 MG PO CAPS
100.0000 mg | ORAL_CAPSULE | Freq: Two times a day (BID) | ORAL | Status: DC
  Administered 2017-05-12: 100 mg via ORAL
  Filled 2017-05-12: qty 1

## 2017-05-12 MED ORDER — IBUPROFEN 600 MG PO TABS
600.0000 mg | ORAL_TABLET | Freq: Three times a day (TID) | ORAL | Status: DC | PRN
Start: 2017-05-12 — End: 2017-05-15

## 2017-05-12 MED ORDER — IBUPROFEN 200 MG PO TABS: 600.0000 mg | ORAL_TABLET | Freq: Three times a day (TID) | ORAL | Status: DC | PRN

## 2017-05-12 MED ORDER — LEVETIRACETAM 500 MG PO TABS
500.0000 mg | ORAL_TABLET | Freq: Two times a day (BID) | ORAL | Status: DC
  Administered 2017-05-12 (×2): 500 mg via ORAL
  Filled 2017-05-12 (×2): qty 1

## 2017-05-12 MED ORDER — ONDANSETRON HCL 4 MG PO TABS: 4.0000 mg | ORAL_TABLET | Freq: Three times a day (TID) | ORAL | Status: DC | PRN

## 2017-05-12 MED ORDER — GABAPENTIN 100 MG PO CAPS
100.0000 mg | ORAL_CAPSULE | Freq: Two times a day (BID) | ORAL | Status: DC
  Administered 2017-05-12 – 2017-05-15 (×6): 100 mg via ORAL
  Filled 2017-05-12: qty 14
  Filled 2017-05-12 (×2): qty 1
  Filled 2017-05-12: qty 14
  Filled 2017-05-12 (×7): qty 1

## 2017-05-12 MED ORDER — MAGNESIUM HYDROXIDE 400 MG/5ML PO SUSP: 30.0000 mL | Freq: Every day | ORAL | Status: DC | PRN

## 2017-05-12 MED ORDER — ACETAMINOPHEN 325 MG PO TABS: 650.0000 mg | ORAL_TABLET | Freq: Four times a day (QID) | ORAL | Status: DC | PRN

## 2017-05-12 MED ORDER — ALUM & MAG HYDROXIDE-SIMETH 200-200-20 MG/5ML PO SUSP: 30.0000 mL | ORAL | Status: DC | PRN
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
## 2017-05-12 NOTE — Plan of Care (Signed)
Problem: Safety: Goal: Periods of time without injury will increase Outcome: Progressing Patient is on q15 minute safety checks and high fall risk precautions. Patient contracts for safety on the unit and remains safe at this time.   

## 2017-05-12 NOTE — Progress Notes (Signed)
Brad Walker is a 26 year old male being admitted involuntarily to [facility] from [facility].  He came to the ED under IVC initiated by his mother for suicidal ideation, aggression towards family (due to mother not paying his drug bill) and substance abuse.  He was recently in treatment for his substance abuse and has relapsed since being discharged.  He has not been following up with his OP provider.  He has medical history of narcolepsy, cataplexy and seizures.  He is diagnosed with Major Depressive Disorder, Cocaine use disorder and Alcohol use disorder.  [facility] that he has had depression and grief for a long time.  "I just haven't done anything about it."  He denies SI/HI and will contract for safety on the unit.  He denies A/V hallucinations.  He reports feeling hopeless, helpless, worthless and depressed.  He admits that he is drinking 4-10 beers 2-3 times per week and relapsed on cocaine.  He denies any pain or discomfort and appears to be in no physical distress.  He has history of seizures and seizure risk bracelet placed.  Oriented him to the unit.  Admission paperwork completed and signed.  Belongings searched and secured in locker # 28.  Skin assessment completed and no skin issues noted.  Q 15 minute checks initiated for safety.  We will monitor the progress towards his goals.

## 2017-05-12 NOTE — Progress Notes (Signed)
Adult Psychoeducational Group Note  Date:  05/12/2017 Time:  10:03 PM  Group Topic/Focus:  Wrap-Up Group:   The focus of this group is to help patients review their daily goal of treatment and discuss progress on daily workbooks.  Participation Level:  Active  Participation Quality:  Appropriate  Affect:  Appropriate  Cognitive:  Alert and Appropriate  Insight: Appropriate and Good  Engagement in Group:  Engaged  Modes of Intervention:  Discussion  Additional Comments:  Pt stated his goal for today was to keep and open mind about receiving treatment here. Pt stated he achieved his goal and felt good about it. Pt rated his over all day and 8 out of 10. Pt stated he attended all groups held today.  Brad Walker  Brad Walker 05/12/2017, 10:03 PM

## 2017-05-12 NOTE — Tx Team (Signed)
Initial Treatment Plan 05/12/2017 4:19 PM Tye SavoySamuel Nabers [id number]    PATIENT STRESSORS: Financial difficulties Occupational concerns Substance abuse Traumatic event   PATIENT STRENGTHS: Capable of independent living Communication skills General fund of knowledge Physical Health Supportive family/friends   PATIENT IDENTIFIED PROBLEMS: Depression  Substance abuse  Suicidal ideation  "Working towards independence"  "Not feeling the way I do"             DISCHARGE CRITERIA:  Improved stabilization in mood, thinking, and/or behavior Need for constant or close observation no longer present Verbal commitment to aftercare and medication compliance Withdrawal symptoms are absent or subacute and managed without 24-hour nursing intervention  PRELIMINARY DISCHARGE PLAN: Outpatient therapy Return to previous living arrangement Medication management  PATIENT/FAMILY INVOLVEMENT: This treatment plan has been presented to and reviewed with the patient, Tye SavoySamuel Stensland.  The patient and family have been given the opportunity to ask questions and make suggestions.  Levin BaconHeather V Jamorian Dimaria, RN 05/12/2017, 4:19 PM

## 2017-05-12 NOTE — Progress Notes (Signed)
Nursing Progress Note [id number]  D) Patient presents with pleasant mood this evening. Patient was observed up in the milieu and attended group this evening. Patient compliant with medications but denied need for sleep medicine. Patient states to Clinical research associatewriter, "at my last rehab program, I gave the nurse an animal fun fact with every medication". Patient reports to Clinical research associatewriter, "did you know two mammals lay eggs?" Patient denies SI/HI/AVH or pain and contracts for safety on the unit. Patient denies concerns for Clinical research associatewriter.  A) Emotional support given. 1:1 interaction and active listening provided. Patient medicated as prescribed. Medications and plan of care reviewed with patient. Patient verbalized understanding without further questions. Snacks and fluids provided. Opportunities for questions or concerns presented to patient. Patient encouraged to continue to work on treatment goals. Labs, vital signs and patient behavior monitored throughout shift. Patient safety maintained with q15 min safety checks. High fall risk precautions in place and reviewed with patient; patient verbalized understanding.  R) Patient receptive to interaction with nurse. Patient remains safe on the unit at this time. Patient denies any adverse medication reactions at this time. Patient is resting in bed without complaints. Will continue to monitor.

## 2017-05-12 NOTE — Progress Notes (Signed)
Patient and family asking Clinical research associatewriter about filling out FMLA paperwork. Family providing paperwork with patient's information filled out. Written and verbal consent obtained from patient to discuss information with parents. Writer advised patient and family to keep paperwork and present to doctor to fill out. Will report to on-coming shift.

## 2017-05-13 DIAGNOSIS — F1020 Alcohol dependence, uncomplicated: Secondary | ICD-10-CM

## 2017-05-13 DIAGNOSIS — Z630 Problems in relationship with spouse or partner: Secondary | ICD-10-CM

## 2017-05-13 DIAGNOSIS — F341 Dysthymic disorder: Secondary | ICD-10-CM

## 2017-05-13 DIAGNOSIS — Z818 Family history of other mental and behavioral disorders: Secondary | ICD-10-CM

## 2017-05-13 DIAGNOSIS — Z62810 Personal history of physical and sexual abuse in childhood: Secondary | ICD-10-CM

## 2017-05-13 DIAGNOSIS — Z560 Unemployment, unspecified: Secondary | ICD-10-CM

## 2017-05-13 DIAGNOSIS — F411 Generalized anxiety disorder: Secondary | ICD-10-CM

## 2017-05-13 DIAGNOSIS — F1910 Other psychoactive substance abuse, uncomplicated: Secondary | ICD-10-CM

## 2017-05-13 DIAGNOSIS — F419 Anxiety disorder, unspecified: Secondary | ICD-10-CM

## 2017-05-13 DIAGNOSIS — R450 Nervousness: Secondary | ICD-10-CM

## 2017-05-13 DIAGNOSIS — R45851 Suicidal ideations: Secondary | ICD-10-CM

## 2017-05-13 DIAGNOSIS — F4312 Post-traumatic stress disorder, chronic: Secondary | ICD-10-CM

## 2017-05-13 DIAGNOSIS — F332 Major depressive disorder, recurrent severe without psychotic features: Principal | ICD-10-CM

## 2017-05-13 DIAGNOSIS — F1420 Cocaine dependence, uncomplicated: Secondary | ICD-10-CM

## 2017-05-13 DIAGNOSIS — G47411 Narcolepsy with cataplexy: Secondary | ICD-10-CM

## 2017-05-13 NOTE — Progress Notes (Signed)
[facility] CSW faxed pt's Examination and Recommendation to Determine Necessity for Involuntary Commitment to the magistrate, and confirmed with Magistrate this has been received on 05/13/17 at approx 1:45pm.  Please reconsult if future social work needs arise.  CSW signing off, as social work intervention is no longer needed.  Brad PeaJonathan F. Shadaya Marschner, LCSW, LCAS, CSI Clinical Social Worker [facility]) Ph: [phone]

## 2017-05-13 NOTE — Progress Notes (Signed)
Patient ID: Brad Walker, male   DOB: November 04, 1990, 26 y.o.   MRN: [id number]    D: Pt has been very bright on the unit today, he continues to report that he should not have been [facility]. Pt reported that he was fine, and that he had no issues. Pt did however report that he needed his paperwork signed for [place] Hudson[place]MLA, Tanika NP was made aware. Pt reported that his depression was a 3, his hopelessness was a 0, and his anxiety was a 3. Pt reported that his goal for today was to stay sober. Pt reported being negative SI/HI, no AH/VH noted. A: 15 min checks continued for patient safety. R: Pt safety maintained.
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
## 2017-05-13 NOTE — BHH Group Notes (Signed)
BHH Group Notes:  (Nursing/MHT/Case Management/Adjunct)  Date:  05/13/2017  Time:  2:45 PM  Type of Therapy:  Psychoeducational Skills  Participation Level:  Active  Participation Quality:  Appropriate  Affect:  Appropriate  Cognitive:  Appropriate  Insight:  Appropriate  Engagement in Group:  Engaged  Modes of Intervention:  Discussion  Summary of Progress/Problems: Pt did attend self inventory group.   Jacquelyne BalintForrest, Gerhard Rappaport Shanta 05/13/2017, 2:45 PM

## 2017-05-13 NOTE — Progress Notes (Signed)
Pt reports he had a good day.  He attended evening group, then spent the rest of the evening pacing the hall with another pt.  They were talking and laughing about other patients joining them in their pacing as if that would be funny.  Pt denies SI/HI/AVH.  He voiced no needs or concerns to this Clinical research associatewriter.  He hopes to be discharged soon.  He was pleasant with staff.  Support and encouragement offered.  Discharge plans are in process.  Safety maintained with q15 minute checks.

## 2017-05-13 NOTE — [facility] Group Notes (Signed)
[facility]  Date/Time:  05/13/2017  11:00AM-12:00PM  Type of Therapy and Topic:  Group Therapy:  Music and Mood  Participation Level:  Active   Description of Group: In this process group, members listened to a variety of genres of music and identified that different types of music evoke different responses.  Patients were encouraged to identify music that was soothing for them and music that was energizing for them.  Patients discussed how this knowledge can help with wellness and recovery in various ways including managing depression and anxiety as well as encouraging healthy sleep habits.    Therapeutic Goals: 1. Patients will explore the impact of different varieties of music on mood 2. Patients will verbalize the thoughts they have when listening to different types of music 3. Patients will identify music that is soothing to them as well as music that is energizing to them 4. Patients will discuss how to use this knowledge to assist in maintaining wellness and recovery 5. Patients will explore the use of music as a coping skill  Summary of Patient Progress:  At the beginning of group, patient expressed he was tired and confused, and at the end of group stated he felt "calm."  Therapeutic Modalities: Solution Focused Brief Therapy Motivational Interviewing Activity   Ambrose MantleMareida Grossman-Orr, LCSW 05/13/2017 8:40 AM

## 2017-05-14 ENCOUNTER — Other Ambulatory Visit (HOSPITAL_COMMUNITY): Payer: BC Managed Care – PPO

## 2017-05-14 DIAGNOSIS — F4310 Post-traumatic stress disorder, unspecified: Secondary | ICD-10-CM

## 2017-05-14 DIAGNOSIS — R569 Unspecified convulsions: Secondary | ICD-10-CM

## 2017-05-14 LAB — TSH: TSH: 1.939 u[IU]/mL (ref 0.350–4.500)

## 2017-05-14 MED ORDER — BUPROPION HCL 75 MG PO TABS
75.0000 mg | ORAL_TABLET | Freq: Every morning | ORAL | Status: DC
  Administered 2017-05-14 – 2017-05-15 (×3): 75 mg via ORAL
  Filled 2017-05-14 (×2): qty 1
  Filled 2017-05-14: qty 7
  Filled 2017-05-14: qty 1

## 2017-05-14 NOTE — Progress Notes (Signed)
Recreation Therapy Notes  Date: 05/14/17 Time: 1000 Location: 500 Hall Dayroom  Group Topic: Coping Skills  Goal Area(s) Addresses:  Patients will be able to identify positive coping skills. Patients will be able to identify benefits of coping skills post d/c.  Intervention:  Dry erase marker, mind map worksheet, pencils  Activity: Mind map.  LRT gave each patient a blank copy of a mind map.  LRT and patients filled in the first eight boxes together.  Patients identified the areas were they would use coping skills as being in the hospital, stress, anxiety, bad news, drama/chaos, moving, loss of a loved one and health problems.  Individually, patients were to identify possible coping skills for each of the eight areas identified.  LRT would then reconvene the group and fill in the coping skills on the board.  Education: PharmacologistCoping Skills, Building control surveyorDischarge Planning.   Education Outcome: Acknowledges understanding/In group clarification offered/Needs additional education.   Clinical Observations/Feedback: Pt did not attend group   Jaymir Struble Lillia AbedLindsay, LRT/CTRS         Caroll RancherLindsay, Daleysa Kristiansen A 05/14/2017 12:42 PM

## 2017-05-14 NOTE — Progress Notes (Signed)
DAR NOTE: Patient presents with calm affect and mood appropriate to situation. Denies suicidal thoughts, pain, auditory and visual hallucinations.  Described energy level as normal and concentration as good.  Rates depression at 4, hopelessness at 3, and anxiety at 2.  Maintained on routine safety checks.  Medications given as prescribed.  Support and encouragement offered as needed.  Attended group and participated.  States goal for today is "figuring out next steps."  Patient observed socializing with peers in the dayroom.  Offered no complaint.

## 2017-05-14 NOTE — Progress Notes (Signed)
Recreation Therapy Notes  INPATIENT RECREATION THERAPY ASSESSMENT  Patient Details Name: Brad Walker MRN: [id number] DOB: [birth year] Today's Date: 05/14/2017  Patient Stressors: Family, Work, Other (Comment) (Depression; Finances)  Pt stated he was brought here involuntarily because family thought he was a suicide risk.  Coping Skills:   Isolate, Substance Abuse, Avoidance, Exercise, Art/Dance, Talking, Music, Sports  Personal Challenges: Anger, Communication, Decision-Making, Expressing Yourself, Relationships, Self-Esteem/Confidence, Stress Management, Substance Abuse, Trusting Others  Leisure Interests (2+):  Games - Video games, Social - Friends, Individual - TV, Individual - Other (Comment), Sports - Other (Comment), Sports - Exercise (Comment) (Cook, Museum/gallery exhibitions officerHiking, Journal, Rugby, Engineer, petroleumGym)  Biochemist, clinicalAwareness of Community Resources:  Yes  Community Resources:  Library, [employer], Cytogeneticistther (Comment) [employer]([facility])  Current Use: Yes  Patient Strengths:  Want to get well; Amazing support system  Patient Identified Areas of Improvement:  Better understanding/control of feelings; More constructive communication  Current Recreation Participation:  Every other day  Patient Goal for Hospitalization:  "Figure out next steps, have plan in place"  [place]  [place] Residence[place]  Current SI (including self-harm):  No  Current HI:  No  Consent to Intern Participation: N/A   Caroll RancherMarjette Icelyn Navarrete, LRT/CTRS  Caroll RancherLindsay, Laydon Martis A 05/14/2017, 2:42 PM
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
## 2017-05-14 NOTE — BHH Group Notes (Signed)
LCSW Group Therapy Note   05/14/2017 1:15pm   Type of Therapy and Topic:  Group Therapy:  Overcoming Obstacles   Participation Level:  Minimal   Description of Group:    In this group patients will be encouraged to explore what they see as obstacles to their own wellness and recovery. They will be guided to discuss their thoughts, feelings, and behaviors related to these obstacles. The group will process together ways to cope with barriers, with attention given to specific choices patients can make. Each patient will be challenged to identify changes they are motivated to make in order to overcome their obstacles. This group will be process-oriented, with patients participating in exploration of their own experiences as well as giving and receiving support and challenge from other group members.   Therapeutic Goals: 1. Patient will identify personal and current obstacles as they relate to admission. 2. Patient will identify barriers that currently interfere with their wellness or overcoming obstacles.  3. Patient will identify feelings, thought process and behaviors related to these barriers. 4. Patient will identify two changes they are willing to make to overcome these obstacles:      Summary of Patient Progress   Came in towards the end of group-willing to participate. "I need to move from insight to application.  For me, this will involve starting depression medication, surrendering and staying focused on my priority of staying clean and sober. I am going to go to the [facility] for IOP.   Therapeutic Modalities:   Cognitive Behavioral Therapy Solution Focused Therapy Motivational Interviewing Relapse Prevention Therapy  Ida RogueRodney B Prashant Glosser, LCSW 05/14/2017 3:39 PM
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
## 2017-05-14 NOTE — Tx Team (Signed)
Interdisciplinary Treatment and Diagnostic Plan Update  05/14/2017 Time of Session: 0830AM Brad SavoySamuel Walker MRN: [id number]  Principal Diagnosis: Major depressive disorder, recurrent severe without psychotic features [facility](HCC)  Secondary Diagnoses: Principal Problem:   Major depressive disorder, recurrent severe without psychotic features (HCC)   Current Medications:  Current Facility-Administered Medications  Medication Dose Route Frequency Provider Last Rate Last Dose  . acetaminophen (TYLENOL) tablet 650 mg  650 mg Oral Q6H PRN Charm RingsLord, Jamison Y, NP      . alum & mag hydroxide-simeth (MAALOX/MYLANTA) 200-200-20 MG/5ML suspension 30 mL  30 mL Oral Q4H PRN Charm RingsLord, Jamison Y, NP      . gabapentin (NEURONTIN) capsule 100 mg  100 mg Oral BID Charm RingsLord, Jamison Y, NP   100 mg at 05/14/17 0834  . ibuprofen (ADVIL,MOTRIN) tablet 600 mg  600 mg Oral Q8H PRN Charm RingsLord, Jamison Y, NP      . levETIRAcetam (KEPPRA) tablet 500 mg  500 mg Oral BID Charm RingsLord, Jamison Y, NP   500 mg at 05/14/17 0834  . magnesium hydroxide (MILK OF MAGNESIA) suspension 30 mL  30 mL Oral Daily PRN Charm RingsLord, Jamison Y, NP      . ondansetron [facility](ZOFRAN) tablet 4 mg  4 mg Oral Q8H PRN Charm RingsLord, Jamison Y, NP       Facility-Administered Medications Ordered in Other Encounters  Medication Dose Route Frequency Provider Last Rate Last Dose  . gadopentetate dimeglumine (MAGNEVIST) injection 20 mL  20 mL Intravenous Once PRN York SpanielWillis, Charles K, MD       PTA Medications: Prescriptions Prior to Admission  Medication Sig Dispense Refill Last Dose  . levETIRAcetam (KEPPRA) 500 MG tablet Take 1 tablet (500 mg total) by mouth 2 (two) times daily. (Patient taking differently: Take 1,000 mg by mouth 2 (two) times daily. ) 60 tablet 3 Past Week at Unknown time  . modafinil (PROVIGIL) 200 MG tablet One tablet in the morning and one in early afternoon 60 tablet 5 unknown    Patient Stressors: Financial difficulties Occupational concerns Substance abuse Traumatic  event  Patient Strengths: Capable of independent living Wellsite geologistCommunication skills General fund of knowledge Physical Health Supportive family/friends  Treatment Modalities: Medication Management, Group therapy, Case management,  1 to 1 session with clinician, Psychoeducation, Recreational therapy.   Physician Treatment Plan for Primary Diagnosis: Major depressive disorder, recurrent severe without psychotic features (HCC) Long Term Goal(s): Improvement in symptoms so as ready for discharge Improvement in symptoms so as ready for discharge   Short Term Goals: Ability to identify changes in lifestyle to reduce recurrence of condition will improve Ability to verbalize feelings will improve Ability to demonstrate self-control will improve Ability to identify changes in lifestyle to reduce recurrence of condition will improve Ability to identify and develop effective coping behaviors will improve Ability to maintain clinical measurements within normal limits will improve Ability to identify triggers associated with substance abuse/mental health issues will improve  Medication Management: Evaluate patient's response, side effects, and tolerance of medication regimen.  Therapeutic Interventions: 1 to 1 sessions, Unit Group sessions and Medication administration.  Evaluation of Outcomes: Progressing  Physician Treatment Plan for Secondary Diagnosis: Principal Problem:   Major depressive disorder, recurrent severe without psychotic features (HCC)  Long Term Goal(s): Improvement in symptoms so as ready for discharge Improvement in symptoms so as ready for discharge   Short Term Goals: Ability to identify changes in lifestyle to reduce recurrence of condition will improve Ability to verbalize feelings will improve Ability to demonstrate self-control will improve Ability  to identify changes in lifestyle to reduce recurrence of condition will improve Ability to identify and develop effective  coping behaviors will improve Ability to maintain clinical measurements within normal limits will improve Ability to identify triggers associated with substance abuse/mental health issues will improve     Medication Management: Evaluate patient's response, side effects, and tolerance of medication regimen.  Therapeutic Interventions: 1 to 1 sessions, Unit Group sessions and Medication administration.  Evaluation of Outcomes: Progressing   RN Treatment Plan for Primary Diagnosis: Major depressive disorder, recurrent severe without psychotic features (HCC) Long Term Goal(s): Knowledge of disease and therapeutic regimen to maintain health will improve  Short Term Goals: Ability to remain free from injury will improve, Ability to verbalize frustration and anger appropriately will improve and Ability to disclose and discuss suicidal ideas  Medication Management: RN will administer medications as ordered by provider, will assess and evaluate patient's response and provide education to patient for prescribed medication. RN will report any adverse and/or side effects to prescribing provider.  Therapeutic Interventions: 1 on 1 counseling sessions, Psychoeducation, Medication administration, Evaluate responses to treatment, Monitor vital signs and CBGs as ordered, Perform/monitor CIWA, COWS, AIMS and Fall Risk screenings as ordered, Perform wound care treatments as ordered.  Evaluation of Outcomes: Progressing   LCSW Treatment Plan for Primary Diagnosis: Major depressive disorder, recurrent severe without psychotic features (HCC) Long Term Goal(s): Safe transition to appropriate next level of care at discharge, Engage patient in therapeutic group addressing interpersonal concerns.  Short Term Goals: Engage patient in aftercare planning with referrals and resources, Facilitate patient progression through stages of change regarding substance use diagnoses and concerns and Identify triggers associated  with mental health/substance abuse issues  Therapeutic Interventions: Assess for all discharge needs, 1 to 1 time with Social worker, Explore available resources and support systems, Assess for adequacy in community support network, Educate family and significant other(s) on suicide prevention, Complete Psychosocial Assessment, Interpersonal group therapy.  Evaluation of Outcomes: Progressing   Progress in Treatment: Attending groups: Yes. Participating in groups: Yes. Taking medication as prescribed: Yes. Toleration medication: Yes. Family/Significant other contact made: No, will contact:  family member/mother if patient consents Patient understands diagnosis: Yes. Discussing patient identified problems/goals with staff: Yes. Medical problems stabilized or resolved: Yes. Denies suicidal/homicidal ideation: Yes. Issues/concerns per patient self-inventory: No. Other: n/a   New problem(s) identified: No, Describe:  n/a  New Short Term/Long Term Goal(s): medication management for mood stabilization/detox, elimination of SI/HI thoughts; development of comprehensive mental wellness/sobriety plan.   Discharge Plan or Barriers: CSW assessing. Pt recently joined CDIOP through [facility] outpatient. Pt has been IVCed by family due to drug use; HI/SI statements and plan to kill himself shared with his parents. MHAG pamphletand AA/NA information also provided to pt for additional community support.   Patient Goal: "to get discharged soon."  Reason for Continuation of Hospitalization:  Anger Depression Medication stabilization Suicidal ideation Withdrawal symptoms  HI thoughts/statements Memory/seizure issues.  Estimated Length of Stay: Thursday 05/17/17  Attendees: Patient: 05/14/2017 10:47 AM  Physician: Dr. Elna Breslow MD 05/14/2017 10:47 AM  Nursing: Angelica Ran RN 05/14/2017 10:47 AM  RN Care Manager: Onnie Boer CM 05/14/2017 10:47 AM  Social Worker: Trula Slade, LCSW 05/14/2017  10:47 AM  Recreational Therapist: x 05/14/2017 10:47 AM  Other: Armandina Stammer NP; Feliz Beam Money NP 05/14/2017 10:47 AM  Other:  05/14/2017 10:47 AM  Other: 05/14/2017 10:47 AM    Scribe for Treatment Team: Ledell Peoples Smart, LCSW 05/14/2017 10:47 AM

## 2017-05-14 NOTE — Plan of Care (Signed)
Problem: Medication: Goal: Compliance with prescribed medication regimen will improve Outcome: Progressing Patient is compliant with medication regimen.   

## 2017-05-15 MED ORDER — GABAPENTIN 100 MG PO CAPS: 100.0000 mg | ORAL_CAPSULE | Freq: Two times a day (BID) | ORAL | 0 refills | Status: AC

## 2017-05-15 MED ORDER — BUPROPION HCL 75 MG PO TABS: 75.0000 mg | ORAL_TABLET | Freq: Every morning | ORAL | 0 refills | Status: AC

## 2017-05-15 MED ORDER — LEVETIRACETAM 500 MG PO TABS: 500.0000 mg | ORAL_TABLET | Freq: Two times a day (BID) | ORAL | 3 refills | Status: DC

## 2017-05-15 NOTE — Plan of Care (Signed)
Problem: [facility] Participation in Recreation Therapeutic Interventions Goal: STG-Patient will identify at least five coping skills for ** STG: Coping Skills - Patient will be able to identify at least 5 coping skills for S/I by conclusion of recreation therapy tx  Outcome: Completed/Met Date Met: 05/15/17 Pt was attended goal setting group and was able to identify different coping skills for a coping skills list to help with his mental health.   Victorino Sparrow, LRT/CTRS

## 2017-05-15 NOTE — Progress Notes (Signed)
Patient ID: Brad SavoySamuel Walker, male   DOB: [birth year], 26 y.o.   MRN: [id number] Patient discharged to home/self care.  Patient acknowledged understanding of all discharge instructions and follow up care.  Patient stated that he was ready to go home and is looking forward to following up in an outpatient setting. Patient bright and eager to discharge.

## 2017-05-15 NOTE — Progress Notes (Signed)
Recreation Therapy Notes  INPATIENT RECREATION TR PLAN  Patient Details Name: Brad Walker MRN: [id number] DOB: 02-15-1991 Today's Date: 05/15/2017  Rec Therapy Plan Is patient appropriate for Therapeutic Recreation?: Yes Treatment times per week: about 3 days Estimated Length of Stay: 5-7 days TR Treatment/Interventions: Group participation (Comment)  Discharge Criteria Pt will be discharged from therapy if:: Discharged Treatment plan/goals/alternatives discussed and agreed upon by:: Patient/family  Discharge Summary Short term goals set: Patient will be able to identify at least 5 coping skills for S/I by conclusion of recreation therapy tx  Short term goals met: Complete Progress toward goals comments: Groups attended Which groups?: Goal setting Reason goals not met: None Therapeutic equipment acquired: N/A Reason patient discharged from therapy: Discharge from hospital Pt/family agrees with progress & goals achieved: Yes Date patient discharged from therapy: 05/15/17   Victorino Sparrow, LRT/CTRS  Ria Comment, Vincient Vanaman A 05/15/2017, 12:17 PM

## 2017-05-15 NOTE — Progress Notes (Signed)
[facility]. Reason being "He would benefit from a smaller facility." Pt notified. States he is hoping to discharge today and will attend [facility] for SAIOP. appt made. Pt asked to call his mother to inform her. "I'd like to talk to her."  Trula SladeHeather Smart, MSW, LCSW Clinical Social Worker 05/15/2017 9:46 AM

## 2017-05-15 NOTE — Progress Notes (Signed)
Recreation Therapy Notes  Date: 05/15/17 Time: 1000 Location: 500 Hall Dayroom  Group Topic: Leisure Education, Goal Setting  Goal Area(s) Addresses:  Patient will be able to identify at least 3 goals for life participation.  Patient will be able to identify benefit of investing in life goals.  Patient will be able to identify benefit of setting life goals.   Behavioral Response:  Engaged  Intervention: Pencils, goals worksheet  Activity: Life Goals.  Patients were given a worksheet with six categories (family, friends, work/school, body, spirituality and mental health).  For each category, patients were to identify what they were doing well, what they needed to improve and set a goal for making the improvement.  Education:  Discharge Planning, PharmacologistCoping Skills, Leisure Education   Education Outcome: Acknowledges Education/In Group Clarification Provided/Needs Additional Education  Clinical Observations:  Pt stated goals are "time sensitive and measurable".  Pt identified his top three areas as mental health, friends and family.  Pt explained that with his family he was doing well at being honest with them, needs to improve "regular communication" because he shuts down when he relapses and to improve communication, stated he would call different family members instead of the same ones all the time.  For mental health, pt stated he was doing well with "self exploration", needs to improve using his coping skills and set a goal of creating list of coping skills options he can use.  Lastly, pt stated he was good at reaching out to his friends, needs to improve following through when making plans with them and set a goal of seeing one friend each week.  Pt also encouraged one of his peers to set smaller goals to help him reach his bigger goal.   Caroll RancherMarjette Holbert Caples, LRT/CTRS        Caroll RancherLindsay, Amberia Bayless A 05/15/2017 11:24 AM
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
## 2017-05-15 NOTE — [facility] Suicide Risk Assessment (Signed)
[facility] INPATIENT:  Family/Significant Other Suicide Prevention Education  Suicide Prevention Education:  Education Completed; Lamar BenesLouise Reeve (pt's mother) [phone] has been identified by the patient as the family member/significant other with whom the patient will be residing, and identified as the person(s) who will aid the patient in the event of a mental health crisis (suicidal ideations/suicide attempt).  With written consent from the patient, the family member/significant other has been provided the following suicide prevention education, prior to the and/or following the discharge of the patient.  The suicide prevention education provided includes the following:  Suicide risk factors  Suicide prevention and interventions  National Suicide Hotline telephone number  [facility] assessment telephone number  [facility] Emergency Assistance 911  [facility] and/or Residential Mobile Crisis Unit telephone number  Request made of family/significant other to:  Remove weapons (e.g., guns, rifles, knives), all items previously/currently identified as safety concern.    Remove drugs/medications (over-the-counter, prescriptions, illicit drugs), all items previously/currently identified as a safety concern.  The family member/significant other verbalizes understanding of the suicide prevention education information provided.  The family member/significant other agrees to remove the items of safety concern listed above.  Bernetha Anschutz N Smart LCSW 05/15/2017, 12:39 PM

## 2017-05-15 NOTE — [facility] Suicide Risk Assessment (Signed)
[facility] INPATIENT:  Family/Significant Other Suicide Prevention Education  Suicide Prevention Education:  Patient Refusal for Family/Significant Other Suicide Prevention Education: The patient Brad Walker has refused to provide written consent for family/significant other to be provided Family/Significant Other Suicide Prevention Education during admission and/or prior to discharge.  Physician notified.  SPE completed with pt, as pt refused to consent to family contact. SPI pamphlet provided to pt and pt was encouraged to share information with support network, ask questions, and talk about any concerns relating to SPE. Pt denies access to guns/firearms and verbalized understanding of information provided. Mobile Crisis information also provided to pt.   Deniz Eskridge N Smart LCSW 05/15/2017, 9:45 AM
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
## 2017-05-15 NOTE — Progress Notes (Addendum)
  [facility] :  Will you be returning to the same living situation after discharge:  Yes,  home At discharge, do you have transportation home?: Yes,  family Do you have the ability to pay for your medications: Yes,  [employer] private insurance  Release of information consent forms completed and submitted to medical records by CSW.  Patient to Follow up at: Follow-up Information    [facility] Follow up on 05/21/2017.   Specialty:  Behavioral Health Why:  You have appt with Mr. [facility] at 10:00AM. Please arrive at 9:45AM with insurance card and photo ID. You will be assessed for medication management and SAIOP. Thank you! Contact information: [address] [place] [address] [phone]           Next level of care provider has access to [facility] Link:no  Safety Planning and Suicide Prevention discussed: Yes,  SPE completed with pt; SPI pamphlet and Mobile Crisis information provided.  Have you used any form of tobacco in the last 30 days? (Cigarettes, Smokeless Tobacco, Cigars, and/or Pipes): No  Has patient been referred to the Quitline?: n/a  Patient has been referred for addiction treatment: Yes  Pulte HomesHeather N Smart, LCSW 05/15/2017, 1:00 PM

## 2017-05-15 NOTE — Progress Notes (Signed)
D: Pt denies SI/HI/AVH. Pt is pleasant and cooperative. Pt appears to be better, pt stated he wanted to go to a SA Tx facility. Pt stated he was happy he was getting the help he needed with his depression and was hoping to get help with his cocaine/ ETOH use  A: Pt was offered support and encouragement. Pt was given scheduled medications. Pt was encourage to attend groups. Q 15 minute checks were done for safety.   R:Pt attends groups and interacts well with peers and staff. Pt is taking medication. Pt has no complaints.Pt receptive to treatment and safety maintained on unit.

## 2017-05-16 ENCOUNTER — Other Ambulatory Visit (HOSPITAL_COMMUNITY): Payer: BC Managed Care – PPO

## 2017-05-16 ENCOUNTER — Telehealth (HOSPITAL_COMMUNITY): Payer: Self-pay | Admitting: Licensed Clinical Social Worker

## 2017-05-16 NOTE — Telephone Encounter (Signed)
Pt mother called to inquire about FMLA paperwork for time spent in [facility]. Counselor returned called and notified pt mother that paperwork would be sent to pt's human resources office this morning at 9:00am.

## 2017-05-17 ENCOUNTER — Other Ambulatory Visit (HOSPITAL_COMMUNITY): Payer: BC Managed Care – PPO

## 2017-05-21 ENCOUNTER — Other Ambulatory Visit (HOSPITAL_COMMUNITY): Payer: BC Managed Care – PPO

## 2017-05-23 ENCOUNTER — Other Ambulatory Visit (HOSPITAL_COMMUNITY): Payer: BC Managed Care – PPO

## 2017-05-24 ENCOUNTER — Other Ambulatory Visit (HOSPITAL_COMMUNITY): Payer: BC Managed Care – PPO

## 2017-05-28 ENCOUNTER — Other Ambulatory Visit (HOSPITAL_COMMUNITY): Payer: BC Managed Care – PPO

## 2017-05-30 ENCOUNTER — Other Ambulatory Visit (HOSPITAL_COMMUNITY): Payer: BC Managed Care – PPO

## 2017-05-31 ENCOUNTER — Other Ambulatory Visit (HOSPITAL_COMMUNITY): Payer: BC Managed Care – PPO

## 2017-06-04 ENCOUNTER — Other Ambulatory Visit (HOSPITAL_COMMUNITY): Payer: BC Managed Care – PPO

## 2017-06-06 ENCOUNTER — Other Ambulatory Visit (HOSPITAL_COMMUNITY): Payer: BC Managed Care – PPO

## 2017-06-07 ENCOUNTER — Other Ambulatory Visit (HOSPITAL_COMMUNITY): Payer: BC Managed Care – PPO

## 2017-06-11 ENCOUNTER — Other Ambulatory Visit (HOSPITAL_COMMUNITY): Payer: BC Managed Care – PPO

## 2017-06-13 ENCOUNTER — Other Ambulatory Visit (HOSPITAL_COMMUNITY): Payer: BC Managed Care – PPO

## 2017-06-14 ENCOUNTER — Other Ambulatory Visit (HOSPITAL_COMMUNITY): Payer: BC Managed Care – PPO

## 2017-06-18 ENCOUNTER — Other Ambulatory Visit (HOSPITAL_COMMUNITY): Payer: BC Managed Care – PPO

## 2017-06-20 ENCOUNTER — Other Ambulatory Visit (HOSPITAL_COMMUNITY): Payer: BC Managed Care – PPO

## 2017-06-21 ENCOUNTER — Other Ambulatory Visit (HOSPITAL_COMMUNITY): Payer: BC Managed Care – PPO

## 2017-06-25 ENCOUNTER — Other Ambulatory Visit (HOSPITAL_COMMUNITY): Payer: BC Managed Care – PPO

## 2017-06-27 ENCOUNTER — Other Ambulatory Visit (HOSPITAL_COMMUNITY): Payer: BC Managed Care – PPO

## 2017-06-28 ENCOUNTER — Other Ambulatory Visit (HOSPITAL_COMMUNITY): Payer: BC Managed Care – PPO

## 2017-07-02 ENCOUNTER — Other Ambulatory Visit (HOSPITAL_COMMUNITY): Payer: BC Managed Care – PPO

## 2017-07-04 ENCOUNTER — Other Ambulatory Visit (HOSPITAL_COMMUNITY): Payer: BC Managed Care – PPO

## 2017-07-05 ENCOUNTER — Other Ambulatory Visit (HOSPITAL_COMMUNITY): Payer: BC Managed Care – PPO

## 2017-07-09 ENCOUNTER — Other Ambulatory Visit (HOSPITAL_COMMUNITY): Payer: BC Managed Care – PPO

## 2017-07-11 ENCOUNTER — Other Ambulatory Visit (HOSPITAL_COMMUNITY): Payer: BC Managed Care – PPO

## 2017-07-12 ENCOUNTER — Other Ambulatory Visit (HOSPITAL_COMMUNITY): Payer: BC Managed Care – PPO

## 2017-07-16 ENCOUNTER — Other Ambulatory Visit (HOSPITAL_COMMUNITY): Payer: BC Managed Care – PPO

## 2017-07-18 ENCOUNTER — Other Ambulatory Visit (HOSPITAL_COMMUNITY): Payer: BC Managed Care – PPO

## 2017-07-18 ENCOUNTER — Ambulatory Visit: Payer: BC Managed Care – PPO | Admitting: Adult Health

## 2017-07-19 ENCOUNTER — Other Ambulatory Visit (HOSPITAL_COMMUNITY): Payer: BC Managed Care – PPO

## 2017-07-23 ENCOUNTER — Other Ambulatory Visit (HOSPITAL_COMMUNITY): Payer: BC Managed Care – PPO

## 2017-07-25 ENCOUNTER — Other Ambulatory Visit (HOSPITAL_COMMUNITY): Payer: BC Managed Care – PPO

## 2017-07-30 ENCOUNTER — Other Ambulatory Visit (HOSPITAL_COMMUNITY): Payer: BC Managed Care – PPO

## 2017-08-15 ENCOUNTER — Ambulatory Visit: Payer: BC Managed Care – PPO | Admitting: Neurology

## 2017-08-15 ENCOUNTER — Telehealth: Payer: Self-pay | Admitting: Neurology

## 2017-08-15 NOTE — Telephone Encounter (Signed)
This patient did not show for a revisit appointment today. 

## 2018-01-04 IMAGING — CT CT HEAD W/O CM
3 of 4 series · 19 of 47 positions shown, 23 images · non-contrast
Comparison: CT scan of July 30, 2016.

CLINICAL DATA: Seizure.

EXAM:
CT HEAD WITHOUT CONTRAST
TECHNIQUE: Contiguous axial images were obtained from the base of the skull
through the vertex without intravenous contrast.

[Series 201: head w/o, idose (1) · axial · non-contrast · 0.58mm/px · z∈[+1252,+1388]mm · 13 of 34 slices shown, 17 images]
[im 3/34  brain]
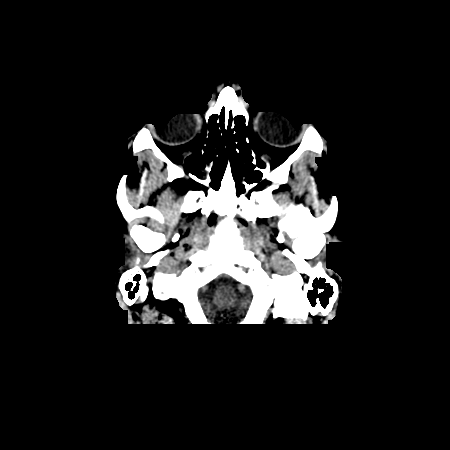
[im 3/34  bone]
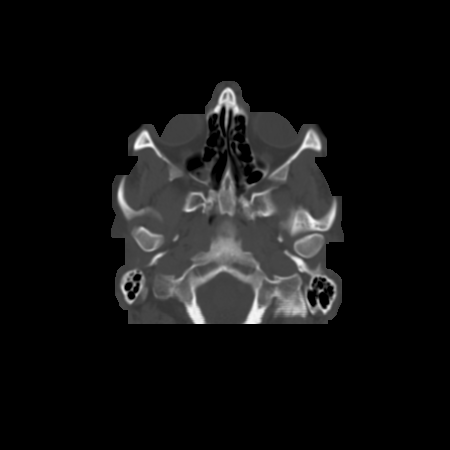
[im 5/34  brain]
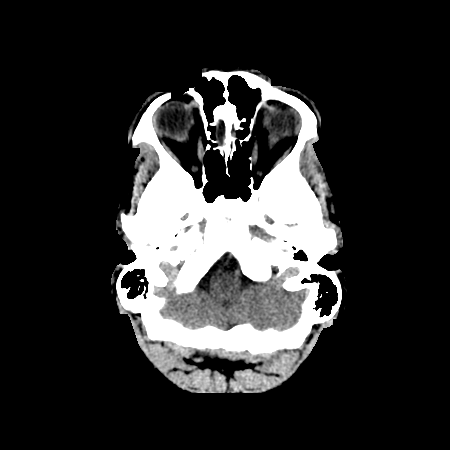
[im 8/34  brain]
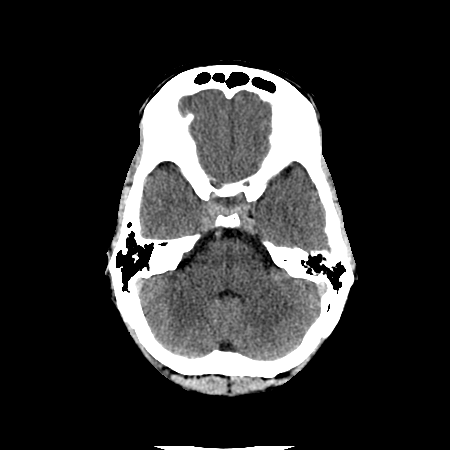
[im 10/34  brain]
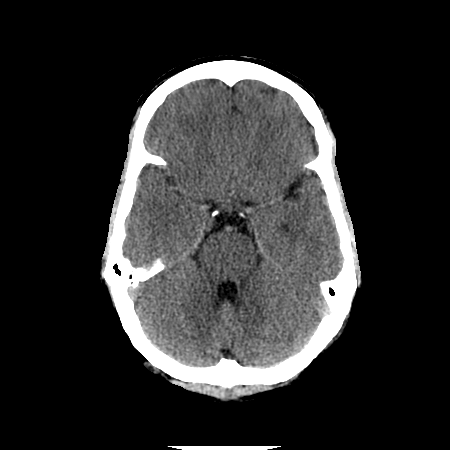
[im 12/34  brain]
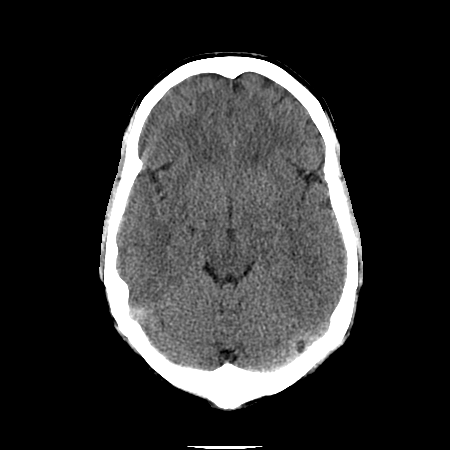
[im 12/34  bone]
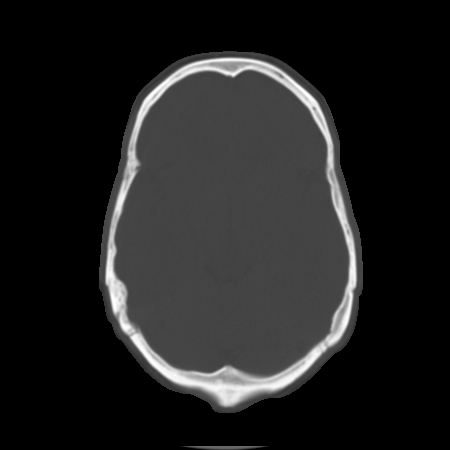
[im 15/34  brain]
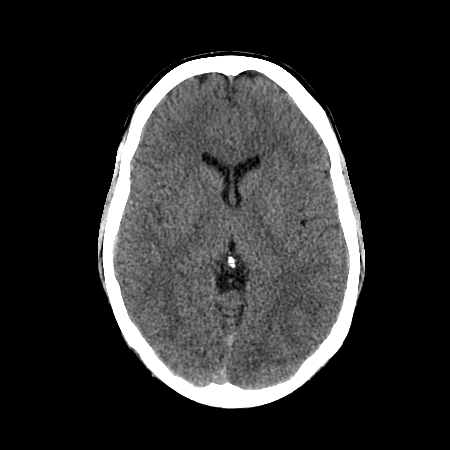
[im 17/34  brain]
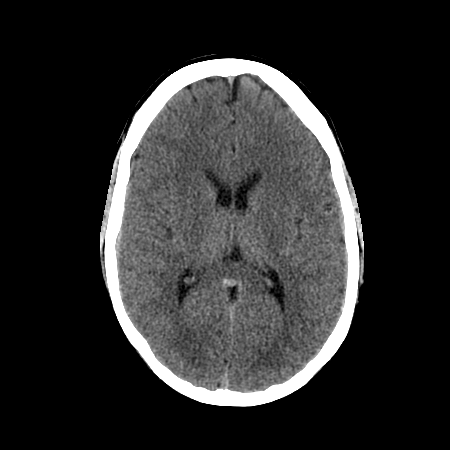
[im 19/34  brain]
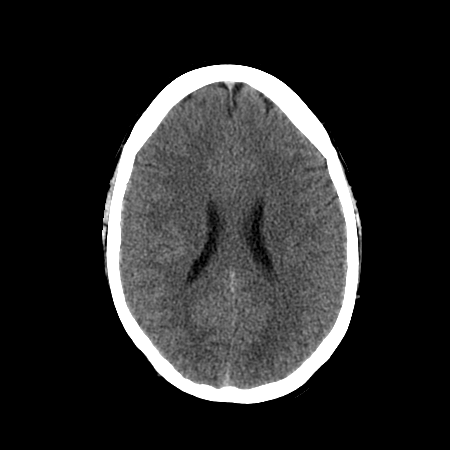
[im 22/34  brain]
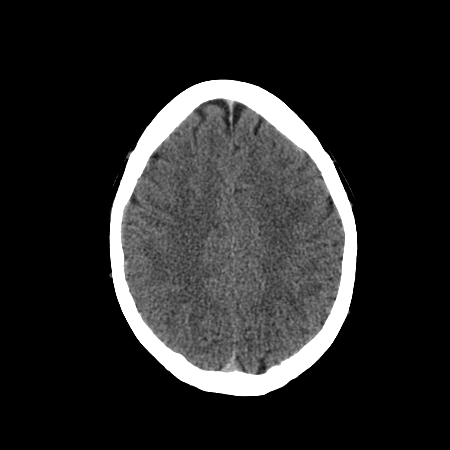
[im 22/34  bone]
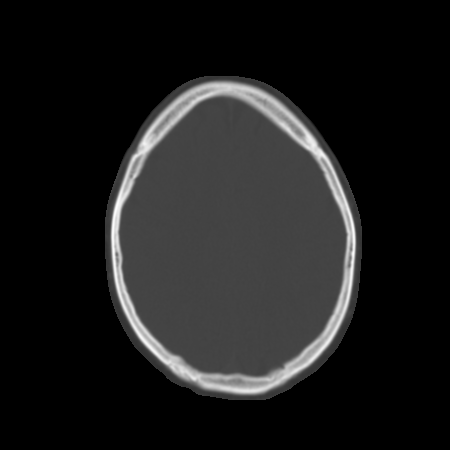
[im 24/34  brain]
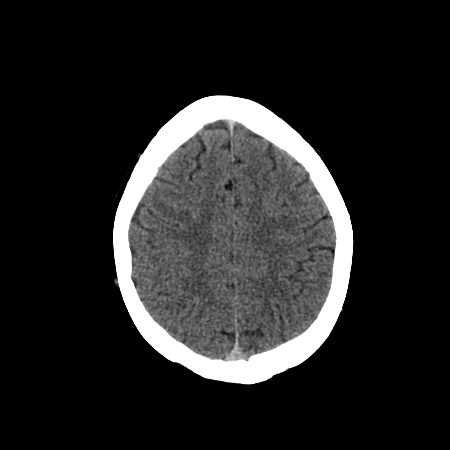
[im 26/34  brain]
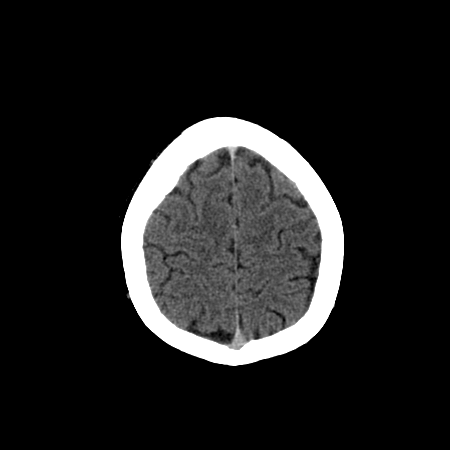
[im 29/34  brain]
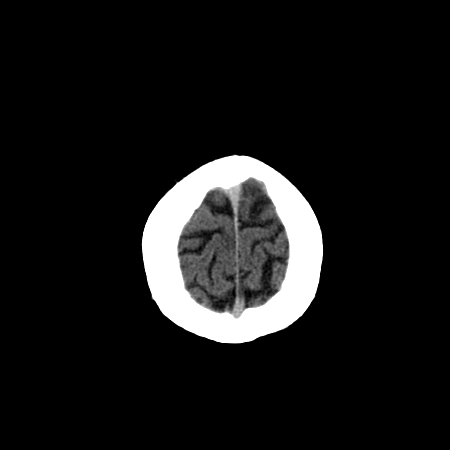
[im 31/34  brain]
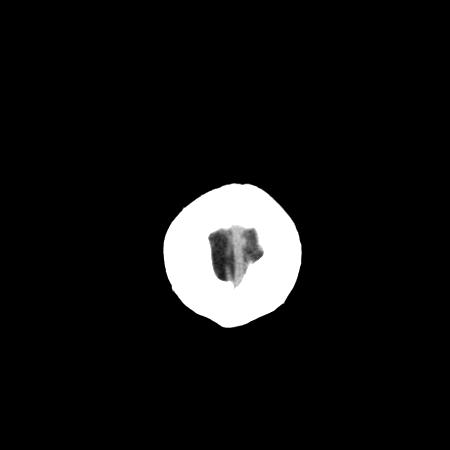
[im 31/34  bone]
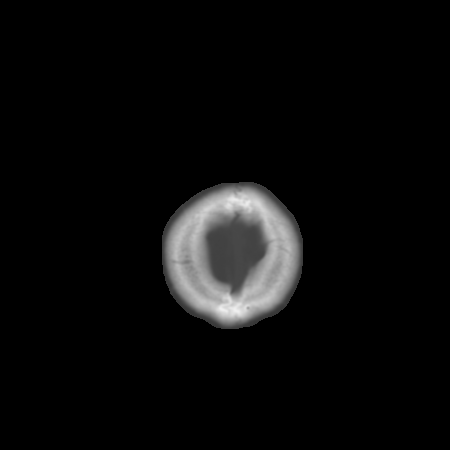

[Series 203: coronal st, idose (1) · coronal · 0.41mm/px · 3 of 77 slices shown]
[im 26/77  brain]
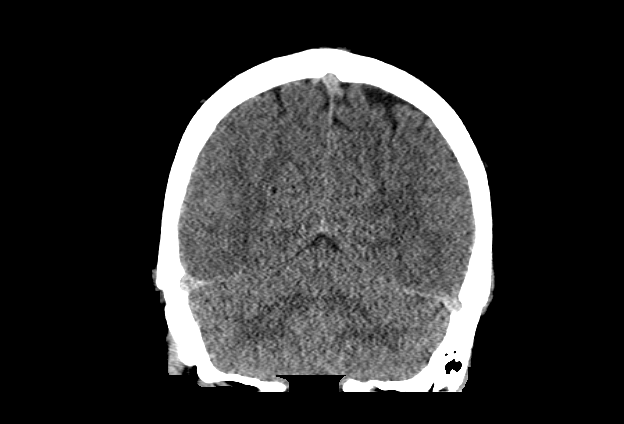
[im 34/77  brain]
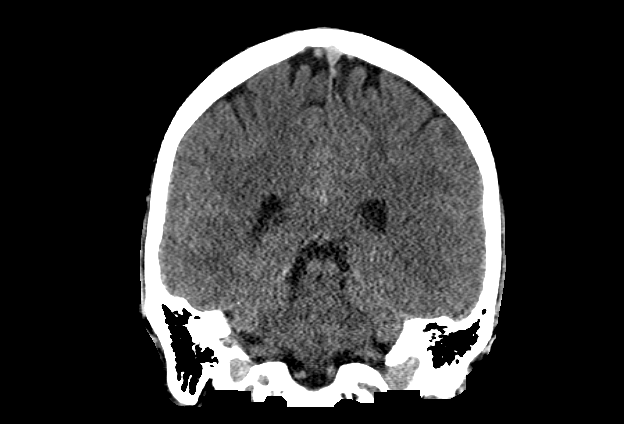
[im 43/77  brain]
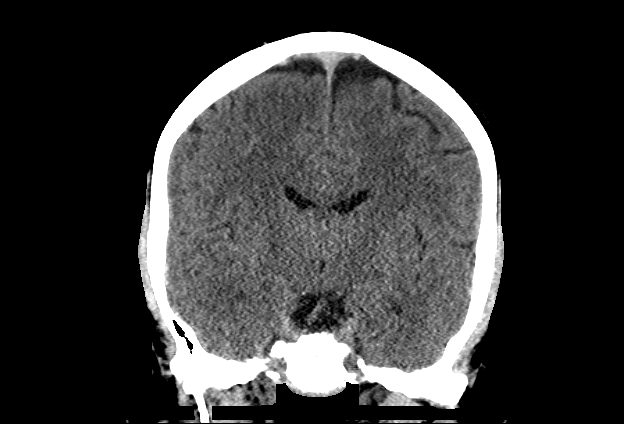

[Series 204: sagittal st, idose (1) · sagittal · 0.41mm/px · 3 of 86 slices shown]
[im 29/86  brain]
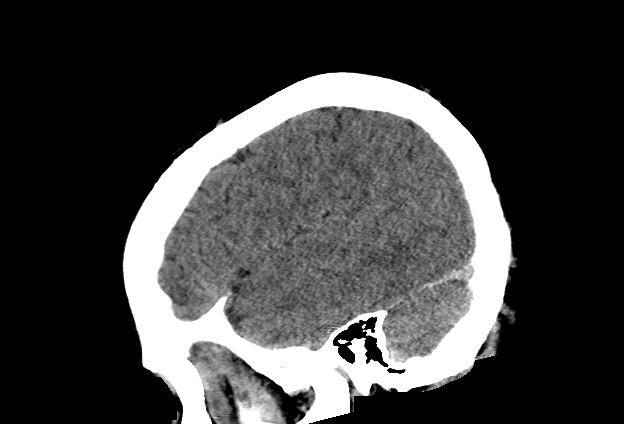
[im 43/86  brain]
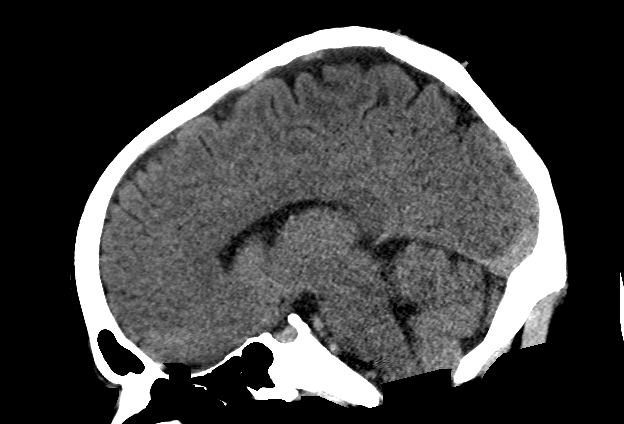
[im 57/86  brain]
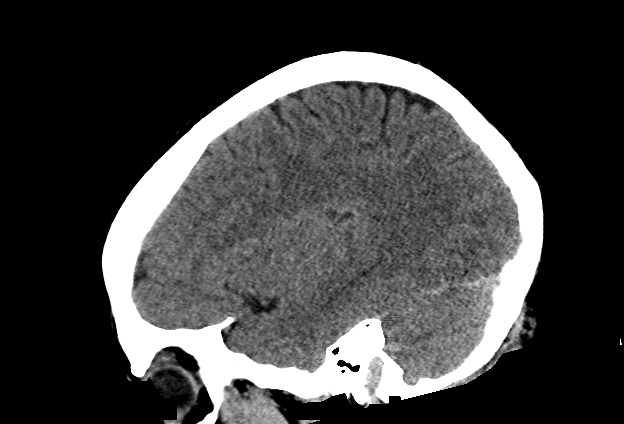

[19 of 47 positions shown; findings below may reference images not displayed]

FINDINGS: Brain: No evidence of acute infarction, hemorrhage, hydrocephalus,
extra-axial collection or mass lesion/mass effect.

Vascular: No hyperdense vessel or unexpected calcification.

Skull: Normal. Negative for fracture or focal lesion.

Sinuses/Orbits: No acute finding.

Other: None.
IMPRESSION: Normal head CT.

## 2018-11-16 ENCOUNTER — Encounter (HOSPITAL_COMMUNITY): Payer: Self-pay | Admitting: *Deleted

## 2018-11-16 ENCOUNTER — Emergency Department (HOSPITAL_COMMUNITY)
Admission: EM | Admit: 2018-11-16 | Discharge: 2018-11-16 | Disposition: A | Discharge status: Home / Self Care | Payer: BC Managed Care – PPO | Attending: Emergency Medicine | Admitting: Emergency Medicine

## 2018-11-16 ENCOUNTER — Emergency Department (HOSPITAL_COMMUNITY): Payer: BC Managed Care – PPO

## 2018-11-16 ENCOUNTER — Other Ambulatory Visit: Payer: Self-pay

## 2018-11-16 DIAGNOSIS — Z91199 Patient's noncompliance with other medical treatment and regimen due to unspecified reason: Secondary | ICD-10-CM

## 2018-11-16 DIAGNOSIS — Z79899 Other long term (current) drug therapy: Secondary | ICD-10-CM | POA: Diagnosis not present

## 2018-11-16 DIAGNOSIS — R4689 Other symptoms and signs involving appearance and behavior: Secondary | ICD-10-CM

## 2018-11-16 DIAGNOSIS — R569 Unspecified convulsions: Secondary | ICD-10-CM | POA: Diagnosis not present

## 2018-11-16 DIAGNOSIS — Z9119 Patient's noncompliance with other medical treatment and regimen: Secondary | ICD-10-CM | POA: Insufficient documentation

## 2018-11-16 LAB — CBC WITH DIFFERENTIAL/PLATELET
ABS IMMATURE GRANULOCYTES: 0.15 10*3/uL — AB (ref 0.00–0.07)
BASOS PCT: 0 %
Basophils Absolute: 0.1 10*3/uL (ref 0.0–0.1)
EOS ABS: 0.1 10*3/uL (ref 0.0–0.5)
EOS PCT: 0 %
HCT: 47.6 % (ref 39.0–52.0)
Hemoglobin: 16 g/dL (ref 13.0–17.0)
Immature Granulocytes: 1 %
Lymphocytes Relative: 7 %
Lymphs Abs: 1 10*3/uL (ref 0.7–4.0)
MCH: 30.2 pg (ref 26.0–34.0)
MCHC: 33.6 g/dL (ref 30.0–36.0)
MCV: 90 fL (ref 80.0–100.0)
Monocytes Absolute: 1 10*3/uL (ref 0.1–1.0)
Monocytes Relative: 7 %
NEUTROS ABS: 11.6 10*3/uL — AB (ref 1.7–7.7)
Neutrophils Relative %: 85 %
PLATELETS: 222 10*3/uL (ref 150–400)
RBC: 5.29 MIL/uL (ref 4.22–5.81)
RDW: 11.9 % (ref 11.5–15.5)
WBC: 13.9 10*3/uL — AB (ref 4.0–10.5)
nRBC: 0 % (ref 0.0–0.2)

## 2018-11-16 LAB — RAPID URINE DRUG SCREEN, HOSP PERFORMED
Amphetamines: NOT DETECTED
BARBITURATES: NOT DETECTED
BENZODIAZEPINES: POSITIVE — AB
COCAINE: POSITIVE — AB
Opiates: NOT DETECTED
Tetrahydrocannabinol: NOT DETECTED

## 2018-11-16 LAB — ETHANOL: Alcohol, Ethyl (B): <10 mg/dL (ref <10)

## 2018-11-16 LAB — COMPREHENSIVE METABOLIC PANEL
ALT: 31 U/L (ref 0–44)
ANION GAP: 12 (ref 5–15)
AST: 33 U/L (ref 15–41)
Albumin: 4.8 g/dL (ref 3.5–5.0)
Alkaline Phosphatase: 63 U/L (ref 38–126)
BUN: 9 mg/dL (ref 6–20)
CHLORIDE: 102 mmol/L (ref 98–111)
CO2: 23 mmol/L (ref 22–32)
Calcium: 9.5 mg/dL (ref 8.9–10.3)
Creatinine, Ser: 1.19 mg/dL (ref 0.61–1.24)
GFR calc non Af Amer: >60 mL/min (ref >60)
Glucose, Bld: 126 mg/dL — ABNORMAL HIGH (ref 70–99)
POTASSIUM: 3.4 mmol/L — AB (ref 3.5–5.1)
SODIUM: 137 mmol/L (ref 135–145)
Total Bilirubin: 1 mg/dL (ref 0.3–1.2)
Total Protein: 7.6 g/dL (ref 6.5–8.1)

## 2018-11-16 LAB — SALICYLATE LEVEL

## 2018-11-16 LAB — ACETAMINOPHEN LEVEL

## 2018-11-16 MED ORDER — LEVETIRACETAM IN NACL 1500 MG/100ML IV SOLN
1500.0000 mg | Freq: Once | INTRAVENOUS | Status: AC
  Administered 2018-11-16: 1500 mg via INTRAVENOUS
  Filled 2018-11-16: qty 100

## 2018-11-16 MED ORDER — LEVETIRACETAM 500 MG PO TABS: 500.0000 mg | ORAL_TABLET | Freq: Two times a day (BID) | ORAL | 3 refills | Status: AC
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
## 2018-11-16 NOTE — ED Notes (Signed)
Bed: WA16 Expected date:  Expected time:  Means of arrival:  Comments: EMS overdose 
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
## 2018-11-16 NOTE — ED Notes (Signed)
Pt given and verbalized understanding of d/c instructions and need for follow up with pcp. Told to return if s/s worsen. Pt leaving with friend. Pt alert and orientedx4 upon d/c in no acute distress.

## 2018-11-16 NOTE — Discharge Instructions (Addendum)
You were seen in the ER for possible seizure like activity and erratic behavior afterwards.   I suspect your possible seizure was from not taking keppra. Possibly from recent use of illicit drug.   Restart keppra.  Stay well hydrated. Follow up with primary care doctor or neurology as scheduled  Return for return of symptoms, prolonged seizures, fevers, headache, neck pain or stiffness.

## 2019-01-07 ENCOUNTER — Telehealth: Payer: Self-pay

## 2019-01-07 NOTE — Telephone Encounter (Signed)
Dr. Leretha Pol MD from [facility] called to request recent EEG report to be faxed to fax # [phone]. I have printed the report and faxed, confirmation received.

## 2019-01-10 ENCOUNTER — Emergency Department (HOSPITAL_COMMUNITY)
Admission: EM | Admit: 2019-01-10 | Discharge: 2019-01-10 | Disposition: A | Discharge status: Home / Self Care | Payer: BC Managed Care – PPO | Attending: Emergency Medicine | Admitting: Emergency Medicine

## 2019-01-10 ENCOUNTER — Other Ambulatory Visit: Payer: Self-pay

## 2019-01-10 ENCOUNTER — Encounter (HOSPITAL_COMMUNITY): Payer: Self-pay

## 2019-01-10 DIAGNOSIS — F1910 Other psychoactive substance abuse, uncomplicated: Secondary | ICD-10-CM | POA: Insufficient documentation

## 2019-01-10 DIAGNOSIS — R251 Tremor, unspecified: Secondary | ICD-10-CM | POA: Diagnosis present

## 2019-01-10 DIAGNOSIS — Z79899 Other long term (current) drug therapy: Secondary | ICD-10-CM | POA: Insufficient documentation

## 2019-01-10 DIAGNOSIS — Z9114 Patient's other noncompliance with medication regimen: Secondary | ICD-10-CM | POA: Insufficient documentation

## 2019-01-10 DIAGNOSIS — Z87898 Personal history of other specified conditions: Secondary | ICD-10-CM | POA: Diagnosis not present

## 2019-01-10 MED ORDER — LORAZEPAM 1 MG PO TABS
1.0000 mg | ORAL_TABLET | Freq: Once | ORAL | Status: AC
  Administered 2019-01-10: 1 mg via ORAL
  Filled 2019-01-10: qty 1

## 2019-01-10 MED ORDER — OLANZAPINE 5 MG PO TABS: 5.0000 mg | ORAL_TABLET | Freq: Every day | ORAL | 0 refills | Status: AC

## 2019-01-10 MED ORDER — ESCITALOPRAM OXALATE 20 MG PO TABS: 20.0000 mg | ORAL_TABLET | Freq: Every day | ORAL | 0 refills | Status: AC

## 2019-01-10 MED ORDER — CARBAMAZEPINE 200 MG PO TABS: 200.0000 mg | ORAL_TABLET | Freq: Two times a day (BID) | ORAL | 0 refills | Status: AC

## 2019-01-10 MED ORDER — LEVETIRACETAM 500 MG PO TABS: 500.0000 mg | ORAL_TABLET | Freq: Two times a day (BID) | ORAL | 0 refills | Status: AC

## 2019-01-10 NOTE — ED Notes (Signed)
Patient escorted off property by security. Patient went willingly and did not require hands-on assistance. Patient took discharge paperwork with him, but refused to sign.

## 2019-01-10 NOTE — ED Notes (Signed)
This RN went in to discharge patient, and patient states he "feels twitchy" and "like Im going to have a seizure." Patient requesting medication prior to discharge. MD called to bedside. Patient states "I dont think this is withdraw. My substance abuse hasnt been bad enough for that recently." VSS. Patient speaking in full sentences. Patient randomly "twitching" every minute or two, but no seizure activity noted.

## 2019-01-10 NOTE — ED Notes (Signed)
Patient continues to talk on the phone and refuses to be discharged until he finishes. Charge RN notified of patient refusal to be discharged.

## 2019-01-10 NOTE — ED Notes (Signed)
Security called to assist in escorting patient off property per Dr Juleen China
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
## 2019-01-10 NOTE — ED Triage Notes (Addendum)
Pt is requesting detox from cocaine and alcohol. He reports last using both last night. Pt also reports a hx of epilepsy and states that he had a seizure in his sleep last night. Pt is not medicated. He states that he has lined up a rehab facility [facility] in [place]), but needs to be cleared before they will accept him.

## 2019-01-10 NOTE — ED Notes (Signed)
Patient reports he feels "much better" after the ordered ativan. This RN notified patient he is up for discharge. Patient picks up his cell phone and calls one of the resource lines on his discharge paperwork, stating "I just dont want to leave here until I know I have a plan."
